# Patient Record
Sex: Female | Born: 1970 | Hispanic: Yes | Marital: Married | Smoking: Never smoker
Health system: Southern US, Community
[De-identification: ages and names within clinical notes are randomized; demographics above are authoritative.]

## PROBLEM LIST (undated history)

## (undated) DIAGNOSIS — F32A Depression, unspecified: Secondary | ICD-10-CM

## (undated) DIAGNOSIS — F419 Anxiety disorder, unspecified: Secondary | ICD-10-CM

## (undated) DIAGNOSIS — Z87442 Personal history of urinary calculi: Secondary | ICD-10-CM

## (undated) DIAGNOSIS — J45909 Unspecified asthma, uncomplicated: Secondary | ICD-10-CM

## (undated) DIAGNOSIS — A048 Other specified bacterial intestinal infections: Secondary | ICD-10-CM

## (undated) DIAGNOSIS — F329 Major depressive disorder, single episode, unspecified: Secondary | ICD-10-CM

## (undated) DIAGNOSIS — E7800 Pure hypercholesterolemia, unspecified: Secondary | ICD-10-CM

## (undated) DIAGNOSIS — D649 Anemia, unspecified: Secondary | ICD-10-CM

## (undated) DIAGNOSIS — B009 Herpesviral infection, unspecified: Secondary | ICD-10-CM

## (undated) DIAGNOSIS — M1990 Unspecified osteoarthritis, unspecified site: Secondary | ICD-10-CM

## (undated) HISTORY — DX: Personal history of urinary calculi: Z87.442

## (undated) HISTORY — DX: Depression, unspecified: F32.A

## (undated) HISTORY — DX: Unspecified asthma, uncomplicated: J45.909

## (undated) HISTORY — DX: Anxiety disorder, unspecified: F41.9

## (undated) HISTORY — DX: Pure hypercholesterolemia, unspecified: E78.00

## (undated) HISTORY — DX: Unspecified osteoarthritis, unspecified site: M19.90

## (undated) HISTORY — DX: Other specified bacterial intestinal infections: A04.8

## (undated) HISTORY — DX: Anemia, unspecified: D64.9

## (undated) HISTORY — DX: Major depressive disorder, single episode, unspecified: F32.9

## (undated) HISTORY — DX: Herpesviral infection, unspecified: B00.9

---

## 2003-03-04 ENCOUNTER — Emergency Department (HOSPITAL_COMMUNITY): Admission: EM | Admit: 2003-03-04 | Discharge: 2003-03-04 | Payer: Self-pay | Admitting: Emergency Medicine

## 2003-03-14 ENCOUNTER — Emergency Department (HOSPITAL_COMMUNITY): Admission: EM | Admit: 2003-03-14 | Discharge: 2003-03-14 | Payer: Self-pay | Admitting: Emergency Medicine

## 2003-12-06 ENCOUNTER — Emergency Department (HOSPITAL_COMMUNITY): Admission: EM | Admit: 2003-12-06 | Discharge: 2003-12-07 | Payer: Self-pay | Admitting: Emergency Medicine

## 2003-12-07 ENCOUNTER — Emergency Department (HOSPITAL_COMMUNITY): Admission: EM | Admit: 2003-12-07 | Discharge: 2003-12-08 | Payer: Self-pay | Admitting: Emergency Medicine

## 2005-01-05 ENCOUNTER — Encounter: Admission: RE | Admit: 2005-01-05 | Discharge: 2005-01-05 | Payer: Self-pay | Admitting: Obstetrics and Gynecology

## 2005-11-25 ENCOUNTER — Other Ambulatory Visit: Admission: RE | Admit: 2005-11-25 | Discharge: 2005-11-25 | Payer: Self-pay | Admitting: Obstetrics and Gynecology

## 2005-11-26 DIAGNOSIS — Z87442 Personal history of urinary calculi: Secondary | ICD-10-CM

## 2005-11-26 HISTORY — DX: Personal history of urinary calculi: Z87.442

## 2005-12-16 ENCOUNTER — Emergency Department (HOSPITAL_COMMUNITY): Admission: EM | Admit: 2005-12-16 | Discharge: 2005-12-16 | Payer: Self-pay | Admitting: Emergency Medicine

## 2006-05-26 ENCOUNTER — Other Ambulatory Visit: Admission: RE | Admit: 2006-05-26 | Discharge: 2006-05-26 | Payer: Self-pay | Admitting: Gynecology

## 2006-09-28 ENCOUNTER — Emergency Department (HOSPITAL_COMMUNITY): Admission: EM | Admit: 2006-09-28 | Discharge: 2006-09-28 | Payer: Self-pay | Admitting: Emergency Medicine

## 2007-01-24 ENCOUNTER — Other Ambulatory Visit: Admission: RE | Admit: 2007-01-24 | Discharge: 2007-01-24 | Payer: Self-pay | Admitting: Gynecology

## 2007-01-27 DIAGNOSIS — B009 Herpesviral infection, unspecified: Secondary | ICD-10-CM

## 2007-01-27 HISTORY — DX: Herpesviral infection, unspecified: B00.9

## 2007-11-21 ENCOUNTER — Other Ambulatory Visit: Admission: RE | Admit: 2007-11-21 | Discharge: 2007-11-21 | Payer: Self-pay | Admitting: Gynecology

## 2007-12-14 ENCOUNTER — Ambulatory Visit (HOSPITAL_COMMUNITY): Admission: RE | Admit: 2007-12-14 | Discharge: 2007-12-14 | Payer: Self-pay | Admitting: Gynecology

## 2007-12-26 ENCOUNTER — Encounter: Admission: RE | Admit: 2007-12-26 | Discharge: 2007-12-26 | Payer: Self-pay | Admitting: Gynecology

## 2008-03-12 ENCOUNTER — Emergency Department (HOSPITAL_COMMUNITY): Admission: EM | Admit: 2008-03-12 | Discharge: 2008-03-12 | Payer: Self-pay | Admitting: Emergency Medicine

## 2008-11-26 ENCOUNTER — Encounter: Payer: Self-pay | Admitting: Gynecology

## 2008-11-26 ENCOUNTER — Other Ambulatory Visit: Admission: RE | Admit: 2008-11-26 | Discharge: 2008-11-26 | Payer: Self-pay | Admitting: Gynecology

## 2008-11-26 ENCOUNTER — Ambulatory Visit: Payer: Self-pay | Admitting: Gynecology

## 2008-11-26 DIAGNOSIS — A048 Other specified bacterial intestinal infections: Secondary | ICD-10-CM

## 2008-11-26 HISTORY — DX: Other specified bacterial intestinal infections: A04.8

## 2008-12-10 ENCOUNTER — Ambulatory Visit: Payer: Self-pay | Admitting: Gynecology

## 2009-07-15 ENCOUNTER — Encounter: Admission: RE | Admit: 2009-07-15 | Discharge: 2009-07-15 | Payer: Self-pay | Admitting: Family Medicine

## 2009-12-20 ENCOUNTER — Other Ambulatory Visit: Admission: RE | Admit: 2009-12-20 | Discharge: 2009-12-20 | Payer: Self-pay | Admitting: Gynecology

## 2009-12-20 ENCOUNTER — Ambulatory Visit: Payer: Self-pay | Admitting: Gynecology

## 2009-12-27 ENCOUNTER — Ambulatory Visit: Payer: Self-pay | Admitting: Gynecology

## 2009-12-30 ENCOUNTER — Ambulatory Visit: Payer: Self-pay | Admitting: Gynecology

## 2010-03-14 ENCOUNTER — Ambulatory Visit: Payer: Self-pay | Admitting: Obstetrics and Gynecology

## 2010-12-29 ENCOUNTER — Encounter (INDEPENDENT_AMBULATORY_CARE_PROVIDER_SITE_OTHER): Payer: 59 | Admitting: Gynecology

## 2010-12-29 ENCOUNTER — Other Ambulatory Visit (HOSPITAL_COMMUNITY)
Admission: RE | Admit: 2010-12-29 | Discharge: 2010-12-29 | Disposition: A | Discharge status: Home / Self Care | Payer: 59 | Source: Ambulatory Visit | Attending: Gynecology | Admitting: Gynecology

## 2010-12-29 ENCOUNTER — Other Ambulatory Visit: Payer: Self-pay | Admitting: Gynecology

## 2010-12-29 DIAGNOSIS — Z124 Encounter for screening for malignant neoplasm of cervix: Secondary | ICD-10-CM | POA: Insufficient documentation

## 2010-12-29 DIAGNOSIS — Z01419 Encounter for gynecological examination (general) (routine) without abnormal findings: Secondary | ICD-10-CM

## 2011-01-05 ENCOUNTER — Ambulatory Visit (INDEPENDENT_AMBULATORY_CARE_PROVIDER_SITE_OTHER): Payer: 59 | Admitting: Gynecology

## 2011-01-05 ENCOUNTER — Other Ambulatory Visit: Payer: 59

## 2011-01-05 DIAGNOSIS — N926 Irregular menstruation, unspecified: Secondary | ICD-10-CM

## 2011-01-05 DIAGNOSIS — N8000 Endometriosis of the uterus, unspecified: Secondary | ICD-10-CM

## 2011-01-05 DIAGNOSIS — D259 Leiomyoma of uterus, unspecified: Secondary | ICD-10-CM

## 2011-01-05 DIAGNOSIS — N831 Corpus luteum cyst of ovary, unspecified side: Secondary | ICD-10-CM

## 2012-01-18 ENCOUNTER — Other Ambulatory Visit: Payer: Self-pay | Admitting: Gynecology

## 2012-01-18 DIAGNOSIS — Z1231 Encounter for screening mammogram for malignant neoplasm of breast: Secondary | ICD-10-CM

## 2012-02-03 ENCOUNTER — Ambulatory Visit
Admission: RE | Admit: 2012-02-03 | Discharge: 2012-02-03 | Disposition: A | Discharge status: Home / Self Care | Payer: BC Managed Care – PPO | Source: Ambulatory Visit | Attending: Gynecology | Admitting: Gynecology

## 2012-02-03 DIAGNOSIS — Z1231 Encounter for screening mammogram for malignant neoplasm of breast: Secondary | ICD-10-CM

## 2012-04-01 ENCOUNTER — Encounter: Payer: BC Managed Care – PPO | Admitting: Gynecology

## 2012-04-07 ENCOUNTER — Ambulatory Visit (INDEPENDENT_AMBULATORY_CARE_PROVIDER_SITE_OTHER): Payer: BC Managed Care – PPO | Admitting: Gynecology

## 2012-04-07 ENCOUNTER — Ambulatory Visit (INDEPENDENT_AMBULATORY_CARE_PROVIDER_SITE_OTHER): Payer: BC Managed Care – PPO

## 2012-04-07 ENCOUNTER — Encounter: Payer: Self-pay | Admitting: Gynecology

## 2012-04-07 VITALS — BP 112/66 | Ht 61.5 in | Wt 136.0 lb

## 2012-04-07 DIAGNOSIS — Z01419 Encounter for gynecological examination (general) (routine) without abnormal findings: Secondary | ICD-10-CM

## 2012-04-07 DIAGNOSIS — N949 Unspecified condition associated with female genital organs and menstrual cycle: Secondary | ICD-10-CM

## 2012-04-07 DIAGNOSIS — R102 Pelvic and perineal pain: Secondary | ICD-10-CM

## 2012-04-07 DIAGNOSIS — Z862 Personal history of diseases of the blood and blood-forming organs and certain disorders involving the immune mechanism: Secondary | ICD-10-CM

## 2012-04-07 DIAGNOSIS — E7800 Pure hypercholesterolemia, unspecified: Secondary | ICD-10-CM | POA: Insufficient documentation

## 2012-04-07 DIAGNOSIS — Z87898 Personal history of other specified conditions: Secondary | ICD-10-CM

## 2012-04-07 DIAGNOSIS — D251 Intramural leiomyoma of uterus: Secondary | ICD-10-CM

## 2012-04-07 DIAGNOSIS — N831 Corpus luteum cyst of ovary, unspecified side: Secondary | ICD-10-CM

## 2012-04-07 DIAGNOSIS — D259 Leiomyoma of uterus, unspecified: Secondary | ICD-10-CM

## 2012-04-07 DIAGNOSIS — Z8639 Personal history of other endocrine, nutritional and metabolic disease: Secondary | ICD-10-CM

## 2012-04-07 NOTE — Patient Instructions (Addendum)
Mantenimiento de la salud en las mujeres (Health Maintenance, Females) Un estilo de vida saludable y los cuidados preventivos pueden favorecer la salud y el bienestar.   Haga exmenes regulares de la salud en general, dentales y de los ojos.   Consuma una dieta saludable. Los alimentos como vegetales, frutas, granos enteros, productos lcteos descremados y protenas magras contienen los nutrientes que usted necesita sin necesidad de consumir muchas caloras. Disminuya el consumo de alimentos con alto contenido de grasas slidas, azcar y sal agregadas. Si es necesario, pdaleinformacin acerca de una dieta adecuada a su mdico.   La actividad fsica regular es una de las cosas ms importantes que puede hacer por su salud. Los adultos deben hacer al menos 150 minutos de ejercicios de intensidad moderada (cualquier actividad que aumente la frecuencia cardaca y lo haga transpirar) cada semana. Adems, la mayora de los adultos necesita ejercicios de fortalecimiento muscular 2  ms das por semana.    Mantenga un peso saludable. El ndice de masa corporal (IMC) es una herramienta que identifica posibles problemas con el peso. Proporciona una estimacin de la grasa corporal basndose en el peso y la altura. El mdico podr determinar su IMC y podr ayudarlo a lograr o mantener un peso saludable. Para los adultos de 20 aos o ms:   Un IMC menor a 18,5 se considera bajo peso.   Un IMC entre 18,5 y 24,9 es normal.   Un IMC entre 25 y 29,9 es sobrepeso.   Un IMC entre 30 o ms es obesidad.   Mantenga un nivel normal de lpidos y colesterol en sangre practicando actividad fsica y minimizando la ingesta de grasas saturadas. Consuma una dieta balanceada e incluya variedad de frutas y vegetales. Los anlisis de lpidos y colesterol en sangre deben comenzar a los 20 aos y repetirse cada 5 aos. Si los niveles de colesterol son altos, tiene ms de 50 aos o tiene riesgo elevado de sufrir enfermedades  cardacas, necesitar controlarse con ms frecuencia.Si tiene niveles elevados de lpidos y colesterol, debe recibir tratamiento con medicamentos, si la dieta y el ejercicio no son efectivos.   Si fuma, consulte con el profesional acerca de las opciones para dejar de hacerlo. Si no lo hace, no comience.   Si est embarazada no beba alcohol. Si est amamantando, beba alcohol con prudencia. Si elige beber alcohol, no se exceda de 1 medida por da. Se considera una medida a 12 onzas (355 ml) de cerveza, 5 onzas (148 ml) de vino, o 1,5 onzas (44 ml) de licor.   Evite el alcohol y el consumo de drogas. No comparta agujas. Pida ayuda si necesita asistencia o instrucciones con respecto a abandonar el consumo de alcohol, cigarrillos o drogas.   La hipertensin arterial causa enfermedades cardacas y aumenta el riesgo de ictus. Debe controlar su presin arterial al menos cada 1 o 2 aos. La presin arterial elevada que persiste debe tratarse con medicamentos si la prdida de peso y el ejercicio no son efectivos.   Si tiene entre 55 y 79 aos, consulte a su mdico si debe tomar aspirina para prevenir enfermedades cardacas.   Los anlisis para la diabetes incluyen la toma de una muestra de sangre para controlar el nivel de azcar en la sangre durante el ayuno. Debe hacerlo cada 3 aos despus de los 45 aos si est dentro de su peso normal y sin factores de riesgo para la diabetes. Las pruebas deben comenzar a edades tempranas o llevarse a cabo con ms frecuencia   si tiene sobrepeso y al menos 1 factor de riesgo para la diabetes.   Las evaluaciones para detectar el cncer de mama son un mtodo preventivo fundamental para las mujeres. Debe practicar la "autoconciencia de las mamas". Esto significa que debe reconocer la apariencia normal de sus mamas y como las siente y pudiendo incluir un autoexamen de mamas. Si detecta algn cambio, no importa cun pequeo sea, debe informarlo a su mdico. Las mujeres entre 20 y  40 aos deben hacer un examen clnico de las mamas como parte del examen regular de salud, cada 1 a 3 aos. Despus de los 40 aos deben hacerlo todos los aos. Deben hacerse una mamografa radografa de mamas ) cada ao, comenzando a los 40 aos. Las mujeres con historia familiar de cncer de mama deben hablar con el mdico para hacer un estudio gentico. Las que tienen ms riesgo deben hacerse resonancia magntica y una mamografa todos los aos.   Un test de Pap se realiza para diagnosticar cncer de cuello de tero. Las mujeres deben hacerse un test de Pap a partir de los 21 aos. Entre los 21 y los 29 aos debe repetirse cada dos aos. Luego de los 30 aos, debe realizarse un test de Pap cada tres aos siempre que los 3 estudios anteriores sean normales. Si le han realizado una histerectoma por un problema que no era cncer u otra enfermedad que podra causar cncer, ya no necesitar un test de Pap. Si tiene entre 65 y 70 aos y ha tenido un test de Pap normal en los ltimos 10 aos, ya no ser necesario realizarlo. Si ha recibido un tratamiento para el cncer cervical o para una enfermedad que podra causar cncer, necesitar realizar un test de Pap y controles durante al menos 20 aos de concluir el tratamiento. Si no se ha hecho el examen con regularidad, debern volver a evaluarse los factores de riesgo (como el tener un nuevo compaero sexual) para determinar si debe volver a realizarse los estudios. Algunas mujeres sufren problemas mdicos que aumentan la probabilidad de contraer cncer cervical. En estos casos, el mdico podr indicar que se realice el test de Pap con ms frecuencia.   La prueba del virus del papiloma humano (VPH) es un anlisis adicional que puede usarse para detectar cncer de cuello de tero. Esta prueba busca la presencia del virus que causa los cambios en el cuello. Las clulas que se recolectan durante el test de Pap pueden usarse para el VPH. La prueba para el VPH puede  usarse para evaluar a mujeres de ms de 30 aos y debe usarse en mujeres de cualquier edad cuyos resultados del test de Pap no sean claros. Despus de los 30 aos, las mujeres deben hacerse el anlisis para el VPH con la misma frecuencia que el test de Pap.   El cncer colorectal puede detectarse y con frecuencia puede prevenirse. La mayor parte de los estudios de rutina comienzan a los 50 aos y continan hasta los 75 aos. Sin embargo, el mdico podr aconsejarle que lo haga antes, si tiene factores de riesgo para el cncer de colon. Una vez por ao, el profesional le dar un kit de prueba para hallar sangre oculta en la materia fecal. La utilizacin de un tubo con una pequea cmara en su extremo para examinar directamente el colon (sigmoidoscopa o colonoscopa), puede detectar formas temprana de cncer colorectal. Hable con su mdico si tiene 50 aos, cuando comience con los estudios de rutina. El examen directo del   colon debe repetirse cada 5 a 10 aos, hasta los 75 aos, excepto que se encuentren formas tempranas de plipos precancerosos o pequeos bultos.   Se recomienda realizar un anlisis de sangre para detectar hepatitis C a todas las personas nacidas entre 1945 y 1965, y a todo aquel que tenga un riesgo conocido de haber contrado esta enfermedad.   Practique el sexo seguro. Use condones y evite las prcticas sexuales riesgosas para disminuir el contagio de enfermedades de transmisin sexual. Las mujeres sexualmente activas de 25 aos o menos deben controlarse para descartar clamidia, que es una infeccin de transmisin sexual frecuente. Las mujeres mayores que tengan mltiples compaeros tambin deben hacerse el anlisis para detectar clamidia. Se recomienda realizar anlisis para detectar otras enfermedades de transmisin sexual si es sexualmente activa y tiene riesgos.   La osteoporosis es una enfermedad en la que los huesos pierden los minerales y la fuerza por el avance de la edad. El  resultado pueden ser fracturas graves en los huesos. El riesgo de osteoporosis puede identificarse con una prueba de densidad sea. Las mujeres de ms de 65 aos y las que tengan riesgos de sufrir fracturas u osteoporosis deben pedir consejo a su mdico. Consulte a su mdico si debe tomar un suplemento de calcio o de vitamina D para reducir el riesgo de osteoporosis.   La menopausia se asocia a sntomas y riesgos fsicos. Se dispone de una terapia de reemplazo hormonal para disminuir los sntomas y los riesgos. Consulte a su mdico para saber si la terapia de reemplazo hormonal es conveniente para usted.   Use una pantalla solar con un factor SPF de 30 o mayor. Aplique pantalla de manera libre y repetida a lo largo del da. Pngase al resguardo del sol cuando la sombra sea ms pequea que usted. Protjase usando mangas y pantalones largos, un sombrero de ala ancha y gafas para el sol todo el ao, siempre que se encuentre en el exterior.   Informe a su mdico si aparecen nuevos lunares o los que tiene se modifican, especialmente en forma y color. Tambin notifique al mdico si un lunar es ms grande que el tamao de una goma de lpiz.   Mantngase al da con las vacunas.  Document Released: 09/03/2011 [employer] Patient Information 2012 [employer]. 

## 2012-04-07 NOTE — Progress Notes (Signed)
Heather Navarro Sep 24, 1971 [id number]   History:    41 y.o.  for annual gyn exam who stated that for the past several months she's had some right lower quadrant discomfort. She states sometimes discomfort is more pronounced right before her menses. She denies any dyspareunia. She stated she has normal menstrual cycles she states that sometimes she knows this-like discharge for a few days after her menses. Her primary physician did her lab work recently. Patient has hypercholesterolemia and is on simvastatin 20 mg daily patient had a mammogram this year which was normal. Patient does her monthly self breast examination..  Past medical history,surgical history, family history and social history were all reviewed and documented in the EPIC chart.  Gynecologic History Patient's last menstrual period was 03/14/2012. Contraception: none Last Pap: 2012. Results were: normal Last mammogram: 2013. Results were: normal  Obstetric History OB History    Grav Para Term Preterm Abortions TAB SAB Ect Mult Living   1 1  1      1      # Outc Date GA Lbr Len/2nd Wgt Sex Del Anes PTL Lv   1 PRE     M SVD  Yes Yes       ROS: A ROS was performed and pertinent positives and negatives are included in the history.  GENERAL: No fevers or chills. HEENT: No change in vision, no earache, sore throat or sinus congestion. NECK: No pain or stiffness. CARDIOVASCULAR: No chest pain or pressure. No palpitations. PULMONARY: No shortness of breath, cough or wheeze. GASTROINTESTINAL: No abdominal pain, nausea, vomiting or diarrhea, melena or bright red blood per rectum. GENITOURINARY: No urinary frequency, urgency, hesitancy or dysuria. MUSCULOSKELETAL: No joint or muscle pain, no back pain, no recent trauma. DERMATOLOGIC: No rash, no itching, no lesions. ENDOCRINE: No polyuria, polydipsia, no heat or cold intolerance. No recent change in weight. HEMATOLOGICAL: No anemia or easy bruising or bleeding. NEUROLOGIC: No headache,  seizures, numbness, tingling or weakness. PSYCHIATRIC: No depression, no loss of interest in normal activity or change in sleep pattern.     Exam: chaperone present  BP 112/66  Ht 5' 1.5" (1.562 m)  Wt 136 lb (61.689 kg)  BMI 25.28 kg/m2  LMP 03/14/2012  Body mass index is 25.28 kg/(m^2).  General appearance : Well developed well nourished female. No acute distress HEENT: Neck supple, trachea midline, no carotid bruits, no thyroidmegaly Lungs: Clear to auscultation, no rhonchi or wheezes, or rib retractions  Heart: Regular rate and rhythm, no murmurs or gallops Breast:Examined in sitting and supine position were symmetrical in appearance, no palpable masses or tenderness,  no skin retraction, no nipple inversion, no nipple discharge, no skin discoloration, no axillary or supraclavicular lymphadenopathy Abdomen: no palpable masses or tenderness, no rebound or guarding Extremities: no edema or skin discoloration or tenderness  Pelvic:  Bartholin, Urethra, Skene Glands: Within normal limits             Vagina: No gross lesions or discharge  Cervix: No gross lesions or discharge  Uterus  anteverted, normal size, shape and consistency, non-tender and mobile  Adnexa  tenderness adnexa on both sides incomplete exam will await results of ultrasound  Anus and perineum  normal   Rectovaginal  normal sphincter tone without palpated masses or tenderness             Hemoccult not done  Ultrasound: Uterus measures 7.9 x 5.2 x 4.4 cm with endometrial stripe of 12.4 mm (last menstrual period 03/14/2012) 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 Right ovary had a corpus luteum cyst measuring 17 x 17 mm with positive color flow in the periphery internal low level echoes left ovary was normal. Free fluid was noted in the cul-de-sac.    Assessment/Plan:  41 y.o. female for annual exam for which I would recommend a  probiotic gel to use intravaginally after intercourse and after her menses such as Repfresh or [place]. She was encouraged to do her monthly self breast examination. We discussed importance of calcium vitamin D and regular exercise for osteoporosis prevention. She's currently being evaluated by the orthopedic surgeon for her knee pains. 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 She was reassured that she has a corpus luteum cyst. Her symptoms may be attributed to mittelschmerz and she can take nonsteroidal over-the-counter when necessary. New Pap smear screening guidelines discussed. No Pap smear done today. Patient's primary physician has done her lab work. She was instructed to continue to do her monthly self breast examination. We will see her back in one year or when necessary.  Ok Edwards MD, 3:30 PM 04/07/2012

## 2013-08-09 ENCOUNTER — Other Ambulatory Visit: Payer: Self-pay

## 2013-08-09 DIAGNOSIS — Z1231 Encounter for screening mammogram for malignant neoplasm of breast: Secondary | ICD-10-CM

## 2013-09-01 ENCOUNTER — Encounter: Payer: Self-pay | Admitting: Gynecology

## 2013-09-01 ENCOUNTER — Ambulatory Visit (INDEPENDENT_AMBULATORY_CARE_PROVIDER_SITE_OTHER): Payer: BC Managed Care – PPO | Admitting: Gynecology

## 2013-09-01 VITALS — BP 120/78 | Ht 61.5 in | Wt 131.0 lb

## 2013-09-01 DIAGNOSIS — Z01419 Encounter for gynecological examination (general) (routine) without abnormal findings: Secondary | ICD-10-CM

## 2013-09-01 MED ORDER — ACYCLOVIR 5 % EX CREA: 1.0000 "application " | TOPICAL_CREAM | CUTANEOUS | Status: DC

## 2013-09-01 NOTE — Patient Instructions (Addendum)
Tetanus, Diphtheria (Td) Vaccine What You Need to Know WHY GET VACCINATED? Tetanus  and diphtheria are very serious diseases. They are rare in the [place] today, but people who do become infected often have severe complications. Td vaccine is used to protect adolescents and adults from both of these diseases. Both tetanus and diphtheria are infections caused by bacteria. Diphtheria spreads from person to person through coughing or sneezing. Tetanus-causing bacteria enter the body through cuts, scratches, or wounds. TETANUS (Lockjaw) causes painful muscle tightening and stiffness, usually all over the body.  It can lead to tightening of muscles in the head and neck so you can't open your mouth, swallow, or sometimes even breathe. Tetanus kills about 1 out of every 5 people who are infected. DIPHTHERIA can cause a thick coating to form in the back of the throat.  It can lead to breathing problems, paralysis, heart failure, and death. Before vaccines, the [place] saw as many as 200,000 cases a year of diphtheria and hundreds of cases of tetanus. Since vaccination began, cases of both diseases have dropped by about 99%. TD VACCINE Td vaccine can protect adolescents and adults from tetanus and diphtheria. Td is usually given as a booster dose every 10 years but it can also be given earlier after a severe and dirty wound or burn. Your doctor can give you more information. Td may safely be given at the same time as other vaccines. SOME PEOPLE SHOULD NOT GET THIS VACCINE  If you ever had a life-threatening allergic reaction after a dose of any tetanus or diphtheria containing vaccine, OR if you have a severe allergy to any part of this vaccine, you should not get Td. Tell your doctor if you have any severe allergies.  Talk to your doctor if you:  have epilepsy or another nervous system problem,  had severe pain or swelling after any vaccine containing diphtheria or tetanus,  ever had  Guillain Barr Syndrome (GBS),  aren't feeling well on the day the shot is scheduled. RISKS OF A VACCINE REACTION With a vaccine, like any medicine, there is a chance of side effects. These are usually mild and go away on their own. Serious side effects are also possible, but are very rare. Most people who get Td vaccine do not have any problems with it. Mild Problems  following Td (Did not interfere with activities)  Pain where the shot was given (about 8 people in 10)  Redness or swelling where the shot was given (about 1 person in 3)  Mild fever (about 1 person in 15)  Headache or Tiredness (uncommon) Moderate Problems following Td (Interfered with activities, but did not require medical attention)  Fever over 102 F (38.9 C) (rare) Severe Problems  following Td (Unable to perform usual activities; required medical attention)  Swelling, severe pain, bleeding, or redness in the arm where the shot was given (rare). Problems that could happen after any vaccine:  Brief fainting spells can happen after any medical procedure, including vaccination. Sitting or lying down for about 15 minutes can help prevent fainting, and injuries caused by a fall. Tell your doctor if you feel dizzy, or have vision changes or ringing in the ears.  Severe shoulder pain and reduced range of motion in the arm where a shot was given can happen, very rarely, after a vaccination.  Severe allergic reactions from a vaccine are very rare, estimated at less than 1 in a million doses. If one were to occur, it would   usually be within a few minutes to a few hours after the vaccination. WHAT IF THERE IS A SERIOUS REACTION? What should I look for?  Look for anything that concerns you, such as signs of a severe allergic reaction, very high fever, or behavior changes. Signs of a severe allergic reaction can include hives, swelling of the face and throat, difficulty breathing, a fast heartbeat, dizziness, and  weakness. These would usually start a few minutes to a few hours after the vaccination. What should I do?  If you think it is a severe allergic reaction or other emergency that can't wait, call 911 or get the person to the nearest hospital. Otherwise, call your doctor.  Afterward, the reaction should be reported to the Vaccine Adverse Event Reporting System (VAERS). Your doctor might file this report, or, you can do it yourself through the VAERS website or by calling 1-508-580-2611. VAERS is only for reporting reactions. They do not give medical advice. THE NATIONAL VACCINE INJURY COMPENSATION PROGRAM The National Vaccine Injury Compensation Program (VICP) is a federal program that was created to compensate people who may have been injured by certain vaccines. Persons who believe they may have been injured by a vaccine can learn about the program and about filing a claim by calling 1-(817) 169-8470 or visiting the [facility] website. HOW CAN I LEARN MORE?  Ask your doctor.  Contact your local or state health department.  Contact the Centers for Disease Control and Prevention (CDC):  Call 816-566-4783 (1-800-CDC-INFO)  Visit CDC's vaccines website CDC Td Vaccine Interim VIS (11/01/12) Document Released: 07/12/2006 Document Revised: 01/09/2013 Document Reviewed: 01/04/2013 [facility] Patient Information 2014 [place], [place]. IUD Informacin sobre el dispositivo intrauterino (Intrauterine Device Information) Un dispositivo intrauterino (DIU) se inserta en el tero e impide el embarazo. Hay dos tipos de DIU:   DIU de cobre: este tipo de DIU est recubierto con un alambre de cobre y se inserta dentro del tero. El cobre hace que el tero y las trompas de [place] produzcan un liquido que [employer] espermatozoides. El DIU de cobre puede Geneticist, molecular durante 10 aos.  DIU con hormona: este tipo de DIU contiene la hormona progestina (progesterona sinttica). Las hormonas hacen que el moco  cervical se haga ms espeso, lo que evita que el esperma ingrese al tero. Tambin hace que la membrana que recubre internamente al tero sea ms delgada lo que impide el implante del vulo fertilizado. La hormona debilita o destruye los espermatozoides que ingresan al tero. Alguno de los tipos de DIU hormonal pueden Geneticist, molecular durante 5 aos y otros tipos pueden dejarse en el lugar por 3 aos. El mdico se asegurar de que usted sea una buena candidata para usar el DIU. Converse con su mdico acerca de los posibles efectos secundarios.  VENTAJAS DEL DISPOSITIVO INTRAUTERINO  El DIU es muy eficaz, reversible, de accin prolongada y de bajo mantenimiento.  No hay efectos secundarios relacionados con el estrgeno.  El DIU puede ser utilizado durante la [employer].  No est asociado con el aumento de [place].  Funciona inmediatamente despus de la insercin.  El DIU hormonal funciona inmediatamente si se inserta dentro de los [address] del inicio del perodo. Ser necesario que utilice un mtodo anticonceptivo adicional durante 7 das si el DIU hormonal se inserta en algn otro momento del ciclo.  El DIU de cobre no interfiere con las hormonas femeninas.  El DIU hormonal puede hacer que los perodos menstruales abundantes se hagan ms ligeros y  que haya menos clicos.  El DIU hormonal puede usarse durante 3 a 5 aos.  El DIU de cobre puede usarse durante 10 aos. DESVENTAJAS DEL DISPOSITIVO INTRAUTERINO  El DIU hormonal puede estar asociado con patrones de sangrado irregular.  El DIU de cobre puede hacer que el flujo menstrual ms abundante y doloroso.  Puede experimentar clicos y sangrado vaginal despus de la insercin. Document Released: 03/04/2010 Document Revised: 05/17/2013 [facility] Patient Information 2014 [place], [place].

## 2013-09-01 NOTE — Progress Notes (Signed)
Heather Navarro 03-23-1971 [id number]   History:    42 y.o.  for annual gyn exam who wanted to discuss today some form of contraception. She is having normal menstrual cycle although this past cycle was low but different whereby she spotted for 2 days and her periods started. The most recent cycle was normal on November 30. She did have some mild cramping but otherwise is doing well. Her PCP is Dr. Katrinka Blazing who is been doing her blood work. 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 Right ovary had a corpus luteum cyst measuring 17 x 17 mm with positive color flow in the periphery internal low level echoes left ovary was normal. Free fluid was noted in the cul-de-sac.   Patient with no prior history of abnormal Pap smear. Her mammogram was December 2013 and was normal.     Past medical history,surgical history, family history and social history were all reviewed and documented in the EPIC chart.  Gynecologic History Patient's last menstrual period was 08/27/2013. Contraception: none Last Pap: 2012. Results were: normal Last mammogram: 2013. Results were: normal  Obstetric History OB History  Gravida Para Term Preterm AB SAB TAB Ectopic Multiple Living  1 1  1      1     # Outcome Date GA Lbr Len/2nd Weight Sex Delivery Anes PTL Lv  1 PRE     M SVD  Y Y       ROS: A ROS was performed and pertinent positives and negatives are included in the history.  GENERAL: No fevers or chills. HEENT: No change in vision, no earache, sore throat or sinus  congestion. NECK: No pain or stiffness. CARDIOVASCULAR: No chest pain or pressure. No palpitations. PULMONARY: No shortness of breath, cough or wheeze. GASTROINTESTINAL: No abdominal pain, nausea, vomiting or diarrhea, melena or bright red blood per rectum. GENITOURINARY: No urinary frequency, urgency, hesitancy or dysuria. MUSCULOSKELETAL: No joint or muscle pain, no back pain, no recent trauma. DERMATOLOGIC: No rash, no itching, no lesions. ENDOCRINE: No polyuria, polydipsia, no heat or cold intolerance. No recent change in weight. HEMATOLOGICAL: No anemia or easy bruising or bleeding. NEUROLOGIC: No headache, seizures, numbness, tingling or weakness. PSYCHIATRIC: No depression, no loss of interest in normal activity or change in sleep pattern.     Exam: chaperone present  BP 120/78  Ht 5' 1.5" (1.562 m)  Wt 131 lb (59.421 kg)  BMI 24.35 kg/m2  LMP 08/27/2013  Body mass index is 24.35 kg/(m^2).  General appearance : Well developed well nourished female. No acute distress HEENT: Neck supple, trachea midline, no carotid bruits, no thyroidmegaly Lungs: Clear to auscultation, no rhonchi or wheezes, or rib retractions  Heart: Regular rate and rhythm, no murmurs or gallops Breast:Examined in sitting and supine position were symmetrical in appearance, no palpable masses or tenderness,  no skin retraction, no nipple inversion, no nipple discharge, no skin discoloration, no axillary or supraclavicular lymphadenopathy Abdomen: no palpable masses or tenderness, no rebound or guarding Extremities: no edema or skin discoloration or  tenderness  Pelvic:  Bartholin, Urethra, Skene Glands: Within normal limits             Vagina: No gross lesions or discharge  Cervix: No gross lesions or discharge  Uterus  anteverted, normal size, shape and consistency, non-tender and mobile  Adnexa  Without masses or tenderness  Anus and perineum  normal   Rectovaginal  normal sphincter tone without palpated masses or  tenderness             Hemoccult not indicated     Assessment/Plan:  42 y.o. female for annual exam who is interested in proceeding with the ParaGard T380A IUD. We had discussed all different types of contraceptive options and she feels that this would work best for her since she has normal menstrual cycles. The risks benefits and pros and cons were discussed as well as literature and information was provided in [place]. Pap smear was not done today in accordance to the new guidelines. She was reminded to schedule her mammogram and to continue to do her monthly breast exam.  Note: This dictation was prepared with  Dragon/digital dictation along withSmart phrase technology. Any transcriptional errors that result from this process are unintentional.   Ok Edwards MD, 5:42 PM 09/01/2013

## 2013-09-06 ENCOUNTER — Ambulatory Visit
Admission: RE | Admit: 2013-09-06 | Discharge: 2013-09-06 | Disposition: A | Discharge status: Home / Self Care | Payer: BC Managed Care – PPO | Source: Ambulatory Visit

## 2013-09-06 DIAGNOSIS — Z1231 Encounter for screening mammogram for malignant neoplasm of breast: Secondary | ICD-10-CM

## 2013-09-08 ENCOUNTER — Telehealth: Payer: Self-pay | Admitting: Gynecology

## 2013-09-08 NOTE — Telephone Encounter (Signed)
09/08/13-LM VM for pt that her BC ins covers the Paraguard IUD at 100% for contraception. Also said needs to be done while on cycle. Pt to call if wants to proceed.WL

## 2014-07-30 ENCOUNTER — Encounter: Payer: Self-pay | Admitting: Gynecology

## 2014-10-30 ENCOUNTER — Ambulatory Visit (INDEPENDENT_AMBULATORY_CARE_PROVIDER_SITE_OTHER): Payer: BLUE CROSS/BLUE SHIELD | Admitting: Gynecology

## 2014-10-30 ENCOUNTER — Other Ambulatory Visit (HOSPITAL_COMMUNITY)
Admission: RE | Admit: 2014-10-30 | Discharge: 2014-10-30 | Disposition: A | Discharge status: Home / Self Care | Payer: BLUE CROSS/BLUE SHIELD | Source: Ambulatory Visit | Attending: Gynecology | Admitting: Gynecology

## 2014-10-30 ENCOUNTER — Encounter: Payer: Self-pay | Admitting: Gynecology

## 2014-10-30 VITALS — BP 126/78 | Ht 61.5 in | Wt 130.0 lb

## 2014-10-30 DIAGNOSIS — Z01419 Encounter for gynecological examination (general) (routine) without abnormal findings: Secondary | ICD-10-CM

## 2014-10-30 DIAGNOSIS — Z1151 Encounter for screening for human papillomavirus (HPV): Secondary | ICD-10-CM | POA: Insufficient documentation

## 2014-10-30 DIAGNOSIS — Z113 Encounter for screening for infections with a predominantly sexual mode of transmission: Secondary | ICD-10-CM

## 2014-10-30 DIAGNOSIS — Z01411 Encounter for gynecological examination (general) (routine) with abnormal findings: Secondary | ICD-10-CM | POA: Diagnosis not present

## 2014-10-30 DIAGNOSIS — R102 Pelvic and perineal pain: Secondary | ICD-10-CM

## 2014-10-30 DIAGNOSIS — F4321 Adjustment disorder with depressed mood: Secondary | ICD-10-CM | POA: Insufficient documentation

## 2014-10-30 DIAGNOSIS — Z8619 Personal history of other infectious and parasitic diseases: Secondary | ICD-10-CM | POA: Insufficient documentation

## 2014-10-30 LAB — WET PREP FOR TRICH, YEAST, CLUE
Clue Cells Wet Prep HPF POC: NONE SEEN
Trich, Wet Prep: NONE SEEN

## 2014-10-30 MED ORDER — PAROXETINE HCL ER 12.5 MG PO TB24: 12.5000 mg | ORAL_TABLET | ORAL | Status: DC

## 2014-10-30 MED ORDER — FLUCONAZOLE 150 MG PO TABS: 150.0000 mg | ORAL_TABLET | Freq: Once | ORAL | Status: DC

## 2014-10-30 MED ORDER — VALACYCLOVIR HCL 500 MG PO TABS: ORAL_TABLET | ORAL | Status: DC

## 2014-10-30 NOTE — Addendum Note (Signed)
Addended by: Terrance Mass on: 10/30/2014 04:53 PM   Modules accepted: Orders

## 2014-10-30 NOTE — Patient Instructions (Addendum)
Paroxetine tablets Qu es este medicamento? La PAROXETINA se utiliza para tratar la depresin. Este medicamento tambin puede ayudar a Leisure centre manager con trastornos de ansiedad, trastorno obsesivo-compulsivo, ataques de pnico, estrs postraumtico o trastorno disfrico premenstrual (TDPM). Este medicamento puede ser utilizado para otros usos; si tiene alguna pregunta consulte con su proveedor de atencin mdica o con su farmacutico. MARCAS COMERCIALES DISPONIBLES: Paxil, Pexeva Qu le debo informar a mi profesional de la salud antes de tomar este medicamento? Necesita saber si usted presenta alguno de los [employer] o situaciones: -trastornos de hemorragias -glaucoma -enfermedad cardiaca -enfermedad renal -enfermedad heptica -bajos niveles de sodio en la sangre -mana o trastorno bipolar -convulsiones -ideas suicidas, planes o intento; si usted o alguien de su familia ha intentado un suicidio previo -si ha tomado un IMAO, tales como [place], Eldepryl, Marplan, Nardil o Parnate -si toma medicamentos que tratan o previenen cogulos sanguneos -una reaccin alrgica o inusual a la paroxetina, a otros medicamentos, alimentos, colorantes o conservadores -si est embarazada o buscando quedar embarazada -si est amamantando a un beb Cmo debo utilizar este medicamento? Tome este medicamento por va oral con un vaso de agua. Siga las instrucciones de la etiqueta del [place]. Este medicamento se puede tomar con o sin alimentos. Tome sus dosis a intervalos regulares. No tome su medicamento con una frecuencia mayor a la indicada. No deje de tomar Coca-Cola repentinamente a menos que as indique su mdico. El detener este medicamento demasiado rpido puede causar efectos secundarios graves o puede empeorar su condicin. Su farmacutico le dar una gua del medicamento especial con cada receta y relleno. Asegrese de leer esta informacin cada vez cuidadosamente. Hable con su  pediatra para informarse acerca del uso de este medicamento en nios. Puede requerir atencin especial. Sobredosis: Pngase en contacto inmediatamente con un centro toxicolgico o una sala de urgencia si usted cree que haya tomado demasiado medicamento. ATENCIN: [employer] es solo para usted. No comparta este medicamento con nadie. Qu sucede si me olvido de una dosis? Si olvida una dosis, tmela lo antes posible. Si es casi la hora de la prxima dosis, tome slo esa dosis. No tome dosis adicionales o dobles. Qu puede interactuar con este medicamento? No tome esta medicina con ninguno de los siguientes medicamentos: -linezolid -IMAOs, tales como Carbex, Eldepryl, Marplan, Nardil y Parnate -azul de metileno (Mining engineer) -pimozida -tioridazina Esta medicina tambin puede interactuar con los siguientes medicamentos: -alcohol -anticidos -aspirina o medicamentos tipo aspirina -atomoxetina -ciertos medicamentos para la depresin, ansiedad o trastornos psicticos -ciertos medicamentos para el ritmo cardaco irregular, tales como propafenona, flecainida, encainida, y quinidina -ciertos medicamentos para migraas, tales como almotriptn, eletriptn, frovatriptn, naratriptn, riztriptn, sumatriptn, zolmitriptn -cimetidina -digoxina -diurticos -fentanilo -fosamprenavir/ritonavir -furazolidona -isoniazida -litio -medicamentos que tratan o previenen cogulos sanguneos, tales como warfarina, enoxaparina y dalteparina -medicamentos para conciliar el sueo -metoprolol -los Ava, medicamentos para el dolor o inflamacin, tales como ibuprofeno o naproxeno -fenobarbital -fenitona -procarbazina -prociclidina -rasagilina -suplementos como hierba de [place], kava kava, valeriana -tamoxifeno -teofilina -triptfano Puede ser que esta lista no menciona todas las posibles interacciones. Informe a su profesional de KB Home	[place] de [employer] productos a base de hierbas,  medicamentos de [place] o suplementos nutritivos que est tomando. Si usted fuma, consume bebidas alcohlicas o si utiliza drogas ilegales, indqueselo tambin a su profesional de KB Home	[place]. Algunas sustancias pueden interactuar con su medicamento. A qu debo estar atento al usar Coca-Cola? Informe a su mdico si sus sntomas no mejoran o si  empeoran. Visite a su mdico o a su profesional de la salud para chequear su evolucin peridicamente. Debido que puede ser necesario tomar este medicamento durante varias semanas para que sea posible observar sus efectos en forma [place], es importante que sigue su tratamiento como recetado por su mdico. Los pacientes y sus familias deben estar atentos si empeora la depresin o ideas suicidas. Tambin est atento a cambios repentinos o severos de emocin, tales como el sentirse ansioso, agitado, lleno de pnico, irritable, hostil, agresivo, impulsivo, inquietud severa, demasiado excitado y hiperactivo o dificultad para conciliar el sueo. Si esto ocurre, especialmente al comenzar con el tratamiento de un antidepresivo o al cambiar de dosis, comunquese con su profesional de KB Home	[place]. Puede experimentar somnolencia o mareos. No conduzca ni utilice maquinaria, ni haga nada que Associate Professor en estado de alerta hasta que sepa cmo le afecta este medicamento. No se siente ni se ponga de pie con rapidez, especialmente si es un paciente de edad avanzada. Esto reduce el riesgo de mareos o [employer]. El alcohol puede interferir con el efecto de este medicamento. Evite consumir bebidas alcohlicas. Se le podr secar la boca. Masticar chicle sin azcar, chupar caramelos duros y beber agua en abundancia le ayudar a mantener la boca hmeda. Qu efectos secundarios puedo tener al [employer] este medicamento? Efectos secundarios que debe informar a su mdico o a Barrister's clerk de la salud tan pronto como sea posible: -Chief of Staff como erupcin cutnea,  picazn o urticarias, hinchazn de la cara, labios o lengua -heces de color oscuro o de aspecto alquitranado, sangre el la orina o vomito -pulso cardiaco rpido, irregular -alucinaciones, prdida del contacto con la realidad -ereccin dolorosa o prolongada (en los hombres) -convulsiones -ideas suicidas u otros cambios de humor -dificultad para orinar o cambios en el volumen de orina -sangrado, magulladuras inusuales -cansancio o debilidad inusual -vmito Efectos secundarios que, por lo general, no requieren atencin mdica (debe informarlos a su mdico o a su profesional de la salud si persisten o si son molestos): -cambios en el apetito, peso -cambios en el deseo sexual o capacidad -estreimiento o diarrea -dificultad para conciliar el sueo -somnolencia -dolor de cabeza -aumento de la sudoracin -dolor o debilidad muscular -temblores Puede ser que esta lista no menciona todos los posibles efectos secundarios. Comunquese a su mdico por asesoramiento mdico Hum[employer] Inc. Usted puede informar los efectos secundarios a la FDA por telfono al 1-800-FDA-1088. Dnde debo guardar mi medicina? Mantngala fuera del alcance de los nios. [place] a [employer], entre 15 y 68 grados C (65 y 5 grados F). Mantenga el envase bien cerrado. Deseche todo el medicamento que no haya utilizado, despus de la fecha de vencimiento. ATENCIN: Este folleto es un resumen. Puede ser que no cubra toda la posible informacin. Si usted tiene preguntas acerca de esta medicina, consulte con su mdico, su farmacutico o su profesional de Technical sales engineer.  2015, Elsevier/Gold Standard. (2012-05-03 15:39:24) Depresin (Depression) La depresin es un sentimiento de tristeza, decaimiento, sufrimiento espiritual, melancola, pesimismo o vaco. En general, hay dos tipos de depresin:  Tristeza o afliccin normal. Todos tenemos este tipo de depresin de vez en cuando, despus de atravesar alguna  experiencia triste, como la prdida de un trabajo o el final de Theatre manager. Este tipo de depresin se considera normal, es de corta duracin y se Biomedical scientist en cuestin de unos [employer] a 2 semanas. La depresin correspondiente a la prdida de un ser querido (duelo) a menudo dura ms que  2semanas, pero normalmente mejora con el tiempo.  Depresin clnica. Este tipo de depresin dura ms que la tristeza o afliccin normal, o interfiere en su capacidad de funcionar en el hogar, en el trabajo y en la escuela. Tambin interfiere en las relaciones personales. Afecta casi todos los aspectos de la vida. La depresin clnica es una enfermedad. Los sntomas de la depresin tambin pueden tener causas diferentes a las mencionadas arriba, por ejemplo:  Enfermedades fsicas. Algunas enfermedades fsicas, como hipotiroidismo, anemia grave, tipos especficos de cncer, diabetes, convulsiones incontroladas, problemas cardacos y pulmonares, ictus y Social research officer, government crnico, se asocian con frecuencia a los sntomas de la depresin.  Efectos secundarios de algunos medicamentos recetados. En algunas personas, determinados tipos de medicamentos pueden causar sntomas de depresin.  Consumo de drogas. El consumo de alcohol y drogas puede causar sntomas de depresin. [place] y duelo normal son:  Jodelle Gross o llorar durante perodos cortos de [place].  Falta de preocupacin por todo (apata).  Dificultad para dormir o dormir demasiado.  No poder disfrutar de las cosas que antes disfrutaba.  El deseo de estar solo todo el tiempo (aislamiento social).  Falta de energa o motivacin.  Dificultad para concentrarse o recordar.  Cambios en el apetito o en el peso.  Inquietud o agitacin. Los sntomas de la depresin [place] son los mismos de la tristeza o duelo normal y tambin [employer] siguientes sntomas:  Sentirse triste o llorar todo el [place].  Sentimientos de culpa o  inutilidad.  Sentimientos de desesperanza o desamparo.  Pensamientos de suicidio o el deseo de daarse a s mismo (ideas suicidas).  Prdida de contacto con la realidad (sntomas psicticos). Ver o escuchar cosas que no son reales (alucinaciones) o tener creencias falsas acerca de su vida o de las personas que lo rodean (delirios y paranoia). DIAGNSTICO El diagnstico de depresin clnica por lo general est basado en la gravedad y la duracin de los sntomas. El mdico le har preguntas sobre su historia clnica y [place] de sustancias, para determinar si la causa de la depresin es una enfermedad fsica, el uso de medicamentos recetados o el abuso de sustancias. El mdico tambin podr indicarle anlisis de [place]. TRATAMIENTO Por lo general, la tristeza y la afliccin normal no requieren tratamiento. Pero a veces se indican antidepresivos durante el duelo para [employer] sntomas de depresin hasta que se resuelvan. El tratamiento de la depresin clnica depende de la gravedad de los sntomas, pero suele incluir antidepresivos, psicoterapia con un profesional de la salud mental o una combinacin de [place]. El mdico lo ayudar a Leisure centre manager tratamiento para usted. La depresin causada por una enfermedad fsica generalmente desaparece con tratamiento mdico adecuado de la enfermedad. Si un medicamento recetado le causa depresin, consulte al mdico si debe suspenderlo, disminuir la dosis o sustituirlo por otro medicamento. La depresin causada por el abuso de alcohol o de drogas desaparece al dejar de usar estas sustancias. Algunos adultos necesitan ayuda profesional para dejar de beber o de consumir drogas. SOLICITE ATENCIN MDICA DE INMEDIATO SI:  Tiene pensamientos acerca de lastimarse o daar a Producer, television/film/video.  Pierde el contacto con la realidad (tiene sntomas psicticos).  Est tomando medicamentos para la depresin y tiene efectos secundarios graves. [place] on Mental Illness: [email or web address]: [email or web address] Document Released: 09/14/2005 Document Revised: 01/29/2014 [employer] Patient Information 2015 [place], [place]. This information is not intended to replace advice given to  you by your health care provider. Make sure you discuss any questions you have with your health care provider. Trastorno de ansiedad generalizada (Generalized Anxiety Disorder) El trastorno de ansiedad generalizada es un trastorno mental. Interfiere en las funciones vitales, incluyendo las [place], el trabajo y la escuela.  Es diferente de la ansiedad normal que todas las personas experimentan en algn momento de su vida en respuesta a sucesos y Chief Executive Officer. En verdad, la ansiedad normal nos ayuda a prepararnos y Brewing technologist acontecimientos y actividades de la vida. La ansiedad normal desaparece despus de que el evento o la actividad ha finalizado.  El trastorno de ansiedad generalizada no est necesariamente relacionada con eventos o actividades especficas. Tambin causa un exceso de ansiedad en proporcin a sucesos o actividades especficas. En este trastorno la ansiedad es difcil de Chief Technology Officer. Los sntomas pueden variar de leves a muy graves. Las personas que sufren de trastorno de ansiedad generalizada pueden tener intensas olas de ansiedad con sntomas fsicos (ataques de pnico).  SNTOMAS  La ansiedad y la preocupacin asociada a este trastorno son difciles de Chief Technology Officer. Esta ansiedad y la preocupacin estn relacionados con muchos eventos de la vida y sus actividades y tambin ocurre durante ms [employer] que no ocurre, durante 6 meses o ms. Las personas que la sufren pueden tener tres o ms de los siguientes sntomas (uno o ms en los nios):   Customer service manager.  Dificultades de concentracin.   Irritabilidad.  Tensin muscular  Dificultad para dormirse o sueo poco  satisfactorio. DIAGNSTICO  Se diagnostica a travs de una evaluacin realizada por el mdico. El mdico le har preguntas acerca de su estado de nimo, sntomas fsicos y sucesos de [place] vida. Le har preguntas sobre su historia clnica, el consumo de alcohol o drogas, incluyendo los medicamentos recetados. [employer] un examen fsico e indicar anlisis de [place]. Ciertas enfermedades y el uso de determinadas sustancias pueden causar sntomas similares a este trastorno. Su mdico lo puede derivar a Teaching laboratory technician en salud mental para una evaluacin ms profunda.Belva Crome  Las terapias siguientes se utilizan en el tratamiento de este trastorno:   Medicamentos - Se recetan antidepresivos para el control diario a Barrister's clerk. Pueden indicarse tambin medicamentos para combatir la [place] graves, especialmente cuando ocurren ataques de pnico.   Terapia conversada (psicoterapia) Ciertos tipos de psicoterapia pueden ser tiles en el tratamiento del trastorno de ansiedad generalizada, proporcionando apoyo, educacin y Fish farm manager. Una forma de psicoterapia llamada terapia cognitivo-conductual puede ensearle formas saludables de pensar y Firefighter a los eventos y actividades de la vida diaria.  Tcnicasde manejo del estrs- Estas tcnicas incluyen el yoga, la meditacin y el ejercicio y pueden ser muy tiles cuando se practican con regularidad. Un especialista en salud mental puede ayudar a determinar qu tratamiento es mejor para usted. Algunas personas obtienen mejora con una terapia. Sin embargo, Producer, television/film/video requieren una combinacin de terapias.  Document Released: 01/09/2013 Document Revised: 01/29/2014 [facility] Patient Information 2015 [place]. This information is not intended to replace advice given to you by your health care provider. Make sure you discuss any questions you have with your health care provider. Vaginitis monilisica (Monilial Vaginitis) La  vaginitis es una inflamacin (irritacin, hinchazn) de la vagina y la vulva. Esta no es una enfermedad de transmisin sexual.  CAUSAS Este tipo de vaginitis lo causa un hongo (candida) que normalmente se encuentra en la vagina. El hongo candida se ha desarrollado Museum/gallery curator  punto de ocasionar problemas en el equilibrio qumico. SNTOMAS  Secrecin vaginal espesa y blanca.  Hinchazn, picazn, enrojecimiento e inflamacin de la vagina y en algunos casos de los labios vaginales (vulva).  Ardor o dolor al [employer].  Dolor en [place]. DIAGNSTICO Los factores que favorecen la vaginitis moniliasica son:  Kyla Balzarine de virginidad y postmenopusicas.  Embarazo.  Infecciones.  Sentir cansancio, estar enferma o estresada, especialmente si ya ha sufrido este problema en el pasado.  Diabetes Buen control ayudar a disminur la probabilidad.  Pldoras anticonceptivas  Ropa interior [place].  El uso de espumas de bao, aerosoles femeninos duchas vaginales o tampones con desodorante.  Algunos antibiticos (medicamentos que destruyen grmenes).  Si contrae alguna enfermedad puede sufrir recurrencias espordicas. [place] profesional que lo asiste prescribir medicamentos.  Hay diferentes tipos de cremas y supositorios vaginales que tratan especficamente la vaginitis monilisica. Para infecciones por hongos recurrentes, utilice un supositorio o crema en la vagina dos veces por semana, o segn se le indique.  Tambin podrn utilizarse cremas con corticoides o anti monilisicas para la picazn o la irritacin de la vulva. Consulte con el profesional que la asiste.  Si la crema no da resultado, podr aplicarse en la vagina una solucin con azul de metileno.  El consumo de yogur puede prevenir este tipo de vaginitis. INSTRUCCIONES [place] todos los medicamentos tal como se le indic.  No mantenga relaciones sexuales hasta que el tratamiento  se haya completado, o segn las indicaciones del profesional que la asiste.  Tome baos de asiento tibios.  No se aplique duchas vaginales.  No utilice tampones, especialmente los perfumados.  Use ropa interior de algodn  [employer] pantalones ajustados y las medias tipo panty.  Comunique a sus compaeros sexuales que sufre una infeccin por hongos. Ellos deben concurrir para un control mdico si tienen sntomas como una urticaria leve o picazn.  Sus compaeros sexuales deben tratarse tambin si la infeccin es difcil de Radiographer, therapeutic.  Practique el sexo seguro - use condones  Algunos medicamentos vaginales ocasionan fallas en los condones de ltex. Los medicamentos vaginales que pueden daar los condones son:  Building services engineer cleocina  Butoconazole (Femstat)  Terconazole (Terazol) supositorios vaginales  Miconazole (Monistat) (es un medicamento de venta libre) SOLICITE ATENCIN MDICA SI:  Waldron Session tiene una temperatura oral de ms de 38,9 C (102 F).  Si la infeccin empeora luego de 2 das de tratamiento.  Si la infeccin no mejora luego de 3 das de tratamiento.  Aparecen ampollas en o alrededor de la vagina.  Si aparece una hemorragia vaginal y no es el momento del perodo.  Siente dolor al [employer].  Presenta problemas intestinales.  Tiene dolor durante las [employer]. Document Released: 06/24/2005 Document Revised: 12/07/2011 [facility] Patient Information 2015 [place]. This information is not intended to replace advice given to you by your health care provider. Make sure you discuss any questions you have with your health care provider.

## 2014-10-30 NOTE — Progress Notes (Signed)
Heather Navarro Jun 09, 1971 [id number]   History:    44 y.o.  for annual gyn exam who had several complaints:  #1 patient separated from her husband living under the same roof is not having issues with depression and anxiety but has no suicidal ideation and has good family support. #2 patient complaining of vaginal discharge and would like to have an STD screen #3 several months lower abdominal discomfort right lower greater than left #4 recurrent HSV #5 discuss contraceptive options  Patient's primary physician is Dr. Tamala Julian who has been treating her for hyperlipidemia and has been doing her blood work. 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 Right ovary had a corpus luteum cyst measuring 17 x 17 mm with positive color flow in the periphery internal low level echoes left ovary was normal. Free fluid was noted in the cul-de-sac.   Patient with no past history of any abnormal Pap smear. She is overdue for her mammogram.  Past medical history,surgical history, family history and social history were all reviewed and documented in the EPIC chart.  Gynecologic History Patient's last menstrual period was 10/23/2014. Contraception: none Last Pap: 2012. Results were: normal Last mammogram: 2014. Results were: Normal but dense  Obstetric History OB History  Gravida Para Term Preterm AB SAB TAB Ectopic Multiple Living  1 1  1      1     # Outcome Date GA Lbr Len/2nd Weight Sex Delivery Anes PTL Lv  1 Preterm     M Vag-Spont  Y Y       ROS: A ROS was performed and pertinent positives and negatives are included in the history.  GENERAL: No fevers or chills. HEENT: No change in vision, no earache, sore throat or sinus congestion.  NECK: No pain or stiffness. CARDIOVASCULAR: No chest pain or pressure. No palpitations. PULMONARY: No shortness of breath, cough or wheeze. GASTROINTESTINAL: No abdominal pain, nausea, vomiting or diarrhea, melena or bright red blood per rectum. GENITOURINARY: No urinary frequency, urgency, hesitancy or dysuria. MUSCULOSKELETAL: No joint or muscle pain, no back pain, no recent trauma. DERMATOLOGIC: No rash, no itching, no lesions. ENDOCRINE: No polyuria, polydipsia, no heat or cold intolerance. No recent change in weight. HEMATOLOGICAL: No anemia or easy bruising or bleeding. NEUROLOGIC: No headache, seizures, numbness, tingling or weakness. PSYCHIATRIC: No depression, no loss of interest in normal activity or change in sleep pattern.     Exam: chaperone present  BP 126/78 mmHg  Ht 5' 1.5" (1.562 m)  Wt 130 lb (58.968 kg)  BMI 24.17 kg/m2  LMP 10/23/2014  Body mass index is 24.17 kg/(m^2).  General appearance : Well developed well nourished female. No acute distress HEENT: Neck supple, trachea midline, no carotid bruits, no thyroidmegaly Lungs: Clear to auscultation, no rhonchi or wheezes, or rib retractions  Heart: Regular rate and rhythm, no murmurs or gallops Breast:Examined in sitting and supine position were symmetrical in appearance, no palpable masses or tenderness,  no skin retraction, no nipple inversion, no nipple discharge, no skin discoloration, no axillary or supraclavicular lymphadenopathy Abdomen: no palpable masses or tenderness, no rebound or guarding Extremities: no edema or skin discoloration or tenderness  Pelvic:  Bartholin, Urethra, Skene Glands: Within normal limits  Vagina: Creamy discharge  Cervix: No gross lesions or discharge  Uterus  anteverted, normal size, shape and consistency, non-tender and mobile  Adnexa  Without masses or tenderness  Anus and perineum  normal   Rectovaginal  normal sphincter tone without palpated masses or tenderness              Hemoccult not indicated   Wet prep yeast moderate WBC rare bacteria GC and Chlamydia culture obtained  Assessment/Plan:  44 y.o. female for annual exam with several issues that were discussed today. #1 for her depression and anxiety she will be placed on Paxil 12.5 mg daily #2 for recurrent HSV she will be placed on Valtrex 500 mg one by mouth daily #3 as part of her STD screening GC and Chlamydia culture along with HIV, RPR, hepatitis B and C was obtained today #4 as a result of her ongoing pelvic pain she will have an ultrasound schedule here within the next few weeks #5 requisition to schedule a three-dimensional mammogram was provided. #6 patient's flu vaccine is up-to-date #7 literature information on the Mirena IUD was provided and after the cultures come back and her ultrasound is completed we will find a later date to place a Mirena IUD during her cycle. #8 Pap smear with HPV screening obtained  Additional length of time 30 minutes   Terrance Mass MD, 4:45 PM 10/30/2014

## 2014-10-31 LAB — GC/CHLAMYDIA PROBE AMP
CT PROBE, AMP APTIMA: NEGATIVE
GC Probe RNA: NEGATIVE

## 2014-10-31 LAB — HIV ANTIBODY (ROUTINE TESTING W REFLEX): HIV: NONREACTIVE

## 2014-10-31 LAB — RPR

## 2014-10-31 LAB — HEPATITIS B SURFACE ANTIGEN: Hepatitis B Surface Ag: NEGATIVE

## 2014-10-31 LAB — HEPATITIS C ANTIBODY: HCV Ab: NEGATIVE

## 2014-11-01 LAB — CYTOLOGY - PAP

## 2014-11-05 ENCOUNTER — Encounter: Payer: Self-pay | Admitting: Gynecology

## 2014-11-05 ENCOUNTER — Other Ambulatory Visit: Payer: Self-pay | Admitting: Gynecology

## 2014-11-05 ENCOUNTER — Ambulatory Visit (INDEPENDENT_AMBULATORY_CARE_PROVIDER_SITE_OTHER): Payer: BLUE CROSS/BLUE SHIELD | Admitting: Gynecology

## 2014-11-05 ENCOUNTER — Ambulatory Visit (INDEPENDENT_AMBULATORY_CARE_PROVIDER_SITE_OTHER): Payer: BLUE CROSS/BLUE SHIELD

## 2014-11-05 DIAGNOSIS — R102 Pelvic and perineal pain: Secondary | ICD-10-CM

## 2014-11-05 DIAGNOSIS — N9489 Other specified conditions associated with female genital organs and menstrual cycle: Secondary | ICD-10-CM

## 2014-11-05 DIAGNOSIS — D251 Intramural leiomyoma of uterus: Secondary | ICD-10-CM

## 2014-11-05 DIAGNOSIS — L739 Follicular disorder, unspecified: Secondary | ICD-10-CM

## 2014-11-05 MED ORDER — ERYTHROMYCIN BASE 500 MG PO TABS: 500.0000 mg | ORAL_TABLET | Freq: Two times a day (BID) | ORAL | Status: DC

## 2014-11-05 NOTE — Patient Instructions (Signed)
Foliculitis  (Folliculitis)  La foliculitis es el enrojecimiento, dolor e hinchazn (inflamacin) de los folculos pilosos. Puede ocurrir en cualquier parte del cuerpo. Las personas con un sistema inmunolgico debilitado, con diabetes u obesidad tienen mayor riesgo de sufrir foliculitis.  CAUSAS   Infecciones bacterianas. sta es la causa ms frecuente.  Infecciones por hongos.  Infecciones virales.  Contacto con ciertas sustancias qumicas, especialmente aceites y alquitrn. La foliculitis crnica puede ser el resultado de las bacterias que viven en las fosas nasales. Las bacterias pueden favorecer los brotes de foliculitis con el [place].  SNTOMAS  La foliculitis ocurre con mayor frecuencia en el cuero cabelludo, los muslos, las piernas, la [place], las nalgas y las reas donde el pelo se afeita con frecuencia. Una primera seal de foliculitis es una , lesin pequea llena de pus, de color blanco o amarillo, y que pica (pstula).[place] Honour lesiones aparecen en un folculo inflamado y rojo. Por lo general miden menos de 0.2 pulgadas (5 mm) de ancho. Cuando hay una infeccin del folculo que se hace ms profundo, se convierte en un fornculo. Un grupo de varios fornculos juntos forma una lesin mayor (carbunclo). El carbunclo ocurre en reas pilosas y sudorosas del cuerpo.  DIAGNSTICO  El mdico har el diagnstico con un examen fsico. Podr tomarle Truddie Coco de una de las lesiones y Personnel officer en un labloratorio. As se determinar la causa de la foliculitis.  TRATAMIENTO  El tratamiento podr incluir:   La aplicacin de compresas calientes en la zona afectada.  Tomar antibiticos por va oral o aplicarlos sobre la piel.  Drenaje de las lesiones si contienen una gran cantidad de pus o lquido.  Depilacin lser para casos de foliculitis de larga duracin. Esto ayuda a Ambulance person del pelo. INSTRUCCIONES PARA EL CUIDADO EN EL HOGAR   Aplique compresas calientes en la  zona afectada segn lo indique su mdico.  Si le han recetado medicamentos, tmelos segn las indicaciones. Tmelos todos, aunque se sienta mejor.  Tome medicamentos de venta libre para Barrister's clerk.  No rasure la piel irritada.  Concurra a las visitas de control con el mdico, segn las indicaciones. SOLICITE ATENCIN MDICA DE INMEDIATO SI:   Observa un aumento del enrojecimiento, hinchazn o dolor en la zona afectada.  Tiene fiebre. ASEGRESE DE QUE:   Comprende estas instrucciones.  Controlar su enfermedad.  Solicitar ayuda de inmediato si no mejora o si empeora. Document Released: 09/14/2005 Document Revised: 03/15/2012 [facility] Patient Information 2015 [place], [place]. This information is not intended to replace advice given to you by your health care provider. Make sure you discuss any questions you have with your health care provider.

## 2014-11-05 NOTE — Progress Notes (Signed)
   Patient presented to the office today to discuss her ultrasound as well as her recent blood work. Patient was seen in the office on February 2 see previous note outlining her history. Patient was having nonspecific low abdominal pelvic pains and very sporadic. Her ultrasound demonstrated the following:  Uterus measured 8.5 x 5.7 x 4.7 cm with endometrial stripe of 10.7 mm patient had a small fibroid 14 x 11 mm and the second measuring 11 mm. Right ovary was normal. Left ovary small follicle measuring 2.3 x 1.7 cm no fluid in the cul-de-sac no adnexal masses.  Last office visit patient wanted to have an STD screen which demonstrated a negative GC and Chlamydia culture, negative HIV RPR hepatitis B and C. Also because of her chronic HSV outbreak she was started on suppressive Valtrex 500 mg daily. For her depression and anxiety she started the Paxil 12.5 mg daily which is starting to help. Pap smear this year demonstrated ASCUS with no high-risk HPV and we had discussed the new guidelines that she would need a follow-up Pap smear with HPV screen in one year.  She brought up to my attention that she noted this sensitive nodular area underneath her axilla. Exam: Both breasts were examined sitting supine position both breasts were symmetrical in appearance no skin discoloration or nipple inversion no palpable mass or tenderness no supraclavicular axillary lymphadenopathy on the left but on the right axillary area there was a subdermal tender slightly inflamed area for folliculitis.  Assessment/plan: #1 Normal pelvic ultrasound patient reassured #2 ASCUS negative HPV we'll repeat in one year #3 right axillary folliculitis will be treated with erythromycin 500 mg one by mouth twice a day for 10 days and she'll return back in 2 weeks for follow-up. If this is completely resolved we'll send her for her overdue screening mammogram if not a diagnostic one will be ordered. #4 patient with her upcoming cycle  would like to have a Mirena IUD placed to help with her menorrhagia as well as for contraception

## 2014-11-20 ENCOUNTER — Encounter: Payer: Self-pay | Admitting: Gynecology

## 2014-11-20 ENCOUNTER — Ambulatory Visit (INDEPENDENT_AMBULATORY_CARE_PROVIDER_SITE_OTHER): Payer: BLUE CROSS/BLUE SHIELD | Admitting: Gynecology

## 2014-11-20 VITALS — BP 120/82

## 2014-11-20 DIAGNOSIS — N644 Mastodynia: Secondary | ICD-10-CM

## 2014-11-20 DIAGNOSIS — L739 Follicular disorder, unspecified: Secondary | ICD-10-CM

## 2014-11-20 NOTE — Progress Notes (Signed)
   Patient presented to the office today for her two-week follow-up as to her right axillary folliculitis for which she was started on erythromycin 500 mg one by mouth twice a day for 10 days. Patient is doing well and stated she no longer feels a nodularity underneath her axilla. She recently has complained of some mastodynia on both breasts since she is getting ready to start her menses. We had discussed before of placing a Mirena IUD which she will schedule with the start of her next cycle.  Exam: Blood pressure 120/82 Breast exam: Both breasts were examined in the sitting supine position. Both breasts are symmetrical in appearance no skin discoloration or nipple inversion no palpable masses or tenderness no supraclavicular axillary lymphadenopathy  Assessment/plan complete resolution of right axillary folliculitis. Patient was reminded to schedule her mammogram. We discussed vitamin E 600 units daily for mastodynia as well as cutting down on caffeine-containing products. We'll see patient in the next few weeks for placement of Mirena IUD.

## 2014-11-23 ENCOUNTER — Telehealth: Payer: Self-pay | Admitting: Gynecology

## 2014-11-23 NOTE — Telephone Encounter (Signed)
11/23/14-I LM VM for pt that her [facility] plan covers the Paraguard for contraception with a $50.00 copay. Advised scheduling during cycle.Pt to call if wishes to proceed.wl  BC Ref #[id number])

## 2015-09-09 ENCOUNTER — Other Ambulatory Visit: Payer: Self-pay

## 2015-09-09 DIAGNOSIS — Z1231 Encounter for screening mammogram for malignant neoplasm of breast: Secondary | ICD-10-CM

## 2015-10-02 ENCOUNTER — Ambulatory Visit: Payer: BLUE CROSS/BLUE SHIELD

## 2015-10-08 ENCOUNTER — Ambulatory Visit
Admission: RE | Admit: 2015-10-08 | Discharge: 2015-10-08 | Disposition: A | Discharge status: Home / Self Care | Payer: BLUE CROSS/BLUE SHIELD | Source: Ambulatory Visit

## 2015-10-08 DIAGNOSIS — Z1231 Encounter for screening mammogram for malignant neoplasm of breast: Secondary | ICD-10-CM

## 2016-04-14 ENCOUNTER — Ambulatory Visit (INDEPENDENT_AMBULATORY_CARE_PROVIDER_SITE_OTHER): Payer: BLUE CROSS/BLUE SHIELD | Admitting: Women's Health

## 2016-04-14 ENCOUNTER — Encounter: Payer: Self-pay | Admitting: Women's Health

## 2016-04-14 VITALS — BP 118/80 | Ht 61.0 in | Wt 130.0 lb

## 2016-04-14 DIAGNOSIS — N898 Other specified noninflammatory disorders of vagina: Secondary | ICD-10-CM | POA: Diagnosis not present

## 2016-04-14 DIAGNOSIS — N3000 Acute cystitis without hematuria: Secondary | ICD-10-CM | POA: Diagnosis not present

## 2016-04-14 DIAGNOSIS — R350 Frequency of micturition: Secondary | ICD-10-CM | POA: Diagnosis not present

## 2016-04-14 LAB — WET PREP FOR TRICH, YEAST, CLUE
CLUE CELLS WET PREP: NONE SEEN
Trich, Wet Prep: NONE SEEN
Yeast Wet Prep HPF POC: NONE SEEN

## 2016-04-14 LAB — URINALYSIS W MICROSCOPIC + REFLEX CULTURE
Bilirubin Urine: NEGATIVE
CASTS: NONE SEEN [LPF]
CRYSTALS: NONE SEEN [HPF]
GLUCOSE, UA: NEGATIVE
Ketones, ur: NEGATIVE
Leukocytes, UA: NEGATIVE
Nitrite: NEGATIVE
Protein, ur: NEGATIVE
Specific Gravity, Urine: 1.015 (ref 1.001–1.035)
Yeast: NONE SEEN [HPF]
pH: 5.5 (ref 5.0–8.0)

## 2016-04-14 MED ORDER — NITROFURANTOIN MONOHYD MACRO 100 MG PO CAPS: 100.0000 mg | ORAL_CAPSULE | Freq: Two times a day (BID) | ORAL | Status: AC

## 2016-04-14 NOTE — Progress Notes (Signed)
Patient ID: Ricarda Frame, female   DOB: 19-May-1971, 45 y.o.   MRN: [id number] Presents with complaint of increased urinary frequency, urinary burning for about 2 weeks that has increased in intensity, mild pain at end of urination. Also noted in increase in discharge. New partner/ condoms. Likely cycle. History of kidney stones.  Exam: Appears well. No CVAT. Sternal genitalia within normal limits, speculum exam scant white discharge wet prep negative. GC/Chlamydia culture taken. Bimanual no CMT or adnexal tenderness. UA trace blood, 0-2 RBCs, 0-5 WBCs, many bacteria  UTI symptoms  Plan: Macrobid twice daily for 7 days take with food. Urine culture pending. Reviewed importance of increasing water intake. UTI prevention discussed. Instructed to call if no relief of symptoms. Condoms encouraged until permanent partner. Keep annual exam appointment next month will check HIV, hepatitis and RPR.

## 2016-04-14 NOTE — Patient Instructions (Signed)

## 2016-04-15 LAB — GC/CHLAMYDIA PROBE AMP
CT Probe RNA: NOT DETECTED
GC PROBE AMP APTIMA: NOT DETECTED

## 2016-04-16 LAB — URINE CULTURE

## 2016-05-06 ENCOUNTER — Encounter: Payer: Self-pay | Admitting: Gynecology

## 2016-05-06 ENCOUNTER — Ambulatory Visit (INDEPENDENT_AMBULATORY_CARE_PROVIDER_SITE_OTHER): Payer: BLUE CROSS/BLUE SHIELD | Admitting: Gynecology

## 2016-05-06 VITALS — BP 122/80 | Ht 61.0 in | Wt 137.0 lb

## 2016-05-06 DIAGNOSIS — Z01419 Encounter for gynecological examination (general) (routine) without abnormal findings: Secondary | ICD-10-CM

## 2016-05-06 DIAGNOSIS — T782XXA Anaphylactic shock, unspecified, initial encounter: Secondary | ICD-10-CM

## 2016-05-06 DIAGNOSIS — R350 Frequency of micturition: Secondary | ICD-10-CM

## 2016-05-06 DIAGNOSIS — M545 Low back pain, unspecified: Secondary | ICD-10-CM

## 2016-05-06 DIAGNOSIS — Z113 Encounter for screening for infections with a predominantly sexual mode of transmission: Secondary | ICD-10-CM

## 2016-05-06 DIAGNOSIS — IMO0001 Reserved for inherently not codable concepts without codable children: Secondary | ICD-10-CM

## 2016-05-06 LAB — URINALYSIS W MICROSCOPIC + REFLEX CULTURE
Bilirubin Urine: NEGATIVE
Casts: NONE SEEN [LPF]
Crystals: NONE SEEN [HPF]
Glucose, UA: NEGATIVE
Hgb urine dipstick: NEGATIVE
KETONES UR: NEGATIVE
LEUKOCYTES UA: NEGATIVE
NITRITE: NEGATIVE
PH: 6 (ref 5.0–8.0)
Protein, ur: NEGATIVE
RBC / HPF: NONE SEEN RBC/HPF (ref <=2)
SPECIFIC GRAVITY, URINE: 1.015 (ref 1.001–1.035)
WBC, UA: NONE SEEN WBC/HPF (ref <=5)
YEAST: NONE SEEN [HPF]

## 2016-05-06 MED ORDER — VALACYCLOVIR HCL 500 MG PO TABS: ORAL_TABLET | ORAL | 12 refills | Status: DC

## 2016-05-06 MED ORDER — HYDROXYZINE HCL 10 MG PO TABS: 10.0000 mg | ORAL_TABLET | Freq: Three times a day (TID) | ORAL | 0 refills | Status: DC | PRN

## 2016-05-06 MED ORDER — PREDNISONE 10 MG (21) PO TBPK: 10.0000 mg | ORAL_TABLET | Freq: Every day | ORAL | 0 refills | Status: DC

## 2016-05-06 NOTE — Progress Notes (Signed)
Heather Navarro Jan 31, 1971 [id number]   History:    45 y.o.  for annual gyn exam on July 18 was treated for a suspected urinary tract infection with Macrobid and cystoscopy she's had itching on her upper and lower extremities. She is also interested in STD screen. She had one partner for short time 8 months ago since she has been separated from her husband. She also has history of HSV for which she takes Valtrex on a when necessary basis and needed a refill. Patient also has complaint is some urinary frequency but no dysuria. Several years ago she had kidney stone. She has complained of occasional left flank discomfort. Her PCP has been doing her blood work which is Dr. Tamala Julian. In the past she has had vitamin D deficiency but she stated all her blood work recently was normal. Patient with no previous history of any abnormal Pap smears.  Past medical history,surgical history, family history and social history were all reviewed and documented in the EPIC chart.  Gynecologic History Patient's last menstrual period was 04/04/2016. Contraception: none Last Pap: 2016. Results were: normal Last mammogram: 2017. Results were: normal  Obstetric History OB History  Gravida Para Term Preterm AB Living  1 1   1   1   SAB TAB Ectopic Multiple Live Births          1    # Outcome Date GA Lbr Len/2nd Weight Sex Delivery Anes PTL Lv  1 Preterm     M Vag-Spont  Y LIV       ROS: A ROS was performed and pertinent positives and negatives are included in the history.  GENERAL: No fevers or chills. HEENT: No change in vision, no earache, sore throat or sinus congestion. NECK: No pain or stiffness. CARDIOVASCULAR: No chest pain or pressure. No palpitations. PULMONARY: No shortness of breath, cough or wheeze. GASTROINTESTINAL: No abdominal pain, nausea, vomiting or diarrhea, melena or bright red blood per rectum. GENITOURINARY: No urinary frequency, urgency, hesitancy or dysuria. MUSCULOSKELETAL: No joint or  muscle pain, no back pain, no recent trauma. DERMATOLOGIC: Rash upper extremities and lower extremities. ENDOCRINE: No polyuria, polydipsia, no heat or cold intolerance. No recent change in weight. HEMATOLOGICAL: No anemia or easy bruising or bleeding. NEUROLOGIC: No headache, seizures, numbness, tingling or weakness. PSYCHIATRIC: No depression, no loss of interest in normal activity or change in sleep pattern.     Exam: chaperone present  BP 122/80   Ht 5\' 1"  (1.549 m)   Wt 137 lb (62.1 kg)   LMP 04/04/2016   BMI 25.89 kg/m   Body mass index is 25.89 kg/m.  General appearance : Well developed well nourished female. No acute distress HEENT: Eyes: no retinal hemorrhage or exudates,  Neck supple, trachea midline, no carotid bruits, no thyroidmegaly Lungs: Clear to auscultation, no rhonchi or wheezes, or rib retractions  Heart: Regular rate and rhythm, no murmurs or gallops Breast:Examined in sitting and supine position were symmetrical in appearance, no palpable masses or tenderness,  no skin retraction, no nipple inversion, no nipple discharge, no skin discoloration, no axillary or supraclavicular lymphadenopathy Abdomen: no palpable masses or tenderness, no rebound or guarding Extremities: no edema or skin discoloration or tenderness  Pelvic:  Bartholin, Urethra, Skene Glands: Within normal limits             Vagina: No gross lesions or discharge  Cervix: No gross lesions or discharge  Uterus  anteverted, normal size, shape and consistency, non-tender and mobile  Adnexa  Without masses or tenderness  Anus and perineum  normal   Rectovaginal  normal sphincter tone without palpated masses or tenderness             Hemoccult not indicated   Urinalysis few bacteria culture pending no red blood cell no white blood cell  Assessment/Plan:  45 y.o. female for annual exam who developed a reaction to Macrobid. She will be placed on a prednisone (Sterapred Unipak 21 tablet) 10 mg to take  over course of 6 days. For her pruritus she'll be prescribed Vistaril 10 mg to take half a tablet twice a day when necessary. As part of her STD screen a GC and Chlamydia culture was obtained and she will stop by the lab for the following blood were: HIV, RPR, hepatitis B and C. We'll wait for the results of the urine culture. Prescription refill for Valtrex for HSV to take on a when necessary basis was provided as well. Patient had been provided with information on the Bevier IUD in the past. Patient was reminded to do her monthly breast exam. Since she has had history vitamin D deficiency in the past have recommended she take vitamin D3 2000 units daily.  15 minutes of additional time was spent going over her anaphylactic reaction to Macrobid and treatment options as well as discussing her past history vitamin D deficiency and tiredness and fatigue.   Terrance Mass MD, 5:06 PM 05/06/2016

## 2016-05-06 NOTE — Patient Instructions (Signed)
Hydroxyzine capsules or tablets Qu es este medicamento? La HIDROXIZINA es un antihistamnico. Este medicamento se [place] para tratar los sntomas de [place]. Tambin se [place] para tratar la ansiedad y tensin. Se puede Contractor en combinacin con otros medicamentos para inducir el sueo antes de una operacin [place]. Este medicamento puede ser utilizado para otros usos; si tiene alguna pregunta consulte con su proveedor de atencin mdica o con su farmacutico. Qu le debo informar a mi profesional de la salud antes de tomar este medicamento? Necesita saber si usted presenta alguno de los siguientes problemas o situaciones: -otras enfermedades crnicas -dificultad para Garment/textile technologist -glaucoma -enfermedad cardiaca -enfermedad renal -enfermedad heptica -enfermedad pulmonar -una reaccin alrgica o inusual a la hidroxizina, a la cetirizina, a otros medicamentos, alimentos, colorantes o conservantes -si est embarazada o buscando quedar embarazada -si est amamantando a un beb Cmo debo utilizar este medicamento? Tome este medicamento por va oral con un vaso lleno de agua. Siga las instrucciones de la etiqueta del [place]. Este medicamento se puede tomar con alimentos o con el [place] sus dosis a intervalos regulares. No tome su medicamento con una frecuencia mayor a la indicada. Hable con su pediatra para informarse acerca del uso de este medicamento en nios. Puede requerir atencin especial. Aunque este medicamento ha sido recetado a nios tan menores como de 6 aos de edad para condiciones selectivas, las precauciones se aplican. Los pacientes de ms de 65 aos de edad pueden presentar reacciones ms fuertes y Designer, industrial/product dosis [employer] Sobredosis: Pngase en contacto inmediatamente con un centro toxicolgico o una sala de urgencia si usted cree que haya tomado demasiado medicamento. ATENCIN: [employer] es solo para usted. No comparta este  medicamento con nadie. Qu sucede si me olvido de una dosis? Si olvida una dosis, tmela lo antes posible. Si es casi la hora de la prxima dosis, tome slo esa dosis. No tome dosis adicionales o dobles. Qu puede interactuar con este medicamento? -alcohol -barbitricos para el sueo o para las convulsiones -medicamentos para resfros o Set designer -medicamentos para la depresin, ansiedad o trastornos emocionales -analgsicos -medicamentos para conciliar el sueo -relajantes musculares Puede ser que esta lista no menciona todas las posibles interacciones. Informe a su profesional de [employer]	[place] de [employer] productos a base de hierbas, medicamentos de [place] o suplementos nutritivos que est tomando. Si usted fuma, consume bebidas alcohlicas o si utiliza drogas ilegales, indqueselo tambin a su profesional de [employer]	[place]. Algunas sustancias pueden interactuar con su medicamento. A qu debo estar atento al usar Coca[employer]Cola? Si los sntomas no mejoran, consulte a su mdico o a su profesional de [employer]	[place]. Puede experimentar mareos o somnolencia. No conduzca ni utilice maquinaria ni haga nada que Associate Professor en estado de alerta hasta que sepa cmo le afecta este medicamento. No se siente ni se ponga de pie con rapidez, especialmente si es un paciente de edad avanzada. Esto reduce el riesgo de mareos o [employer]. El alcohol puede interferir con el efecto de este medicamento. Evite consumir bebidas alcohlicas. Se le podr secar la boca. Masticar chicle sin azcar, chupar caramelos duros y tomar agua en abundancia le ayudar a mantener la boca hmeda. Si el problema no desaparece o es severo, consulte a su mdico. Este medicamento puede resecarle los ojos y provocar visin borrosa. Si [place] lentes de contacto, puede sentir ciertas molestias. Las gotas lubricantes pueden ser tiles. Si el problema no desaparece o es severo, consulte a su mdico de los ojos.  Si se est realizando pruebas  cutneas debido a una alergia, informe a su mdico que est recibiendo Coca[employer]Cola. Qu efectos secundarios puedo tener al [employer] este medicamento? Efectos secundarios que debe informar a su mdico o a Barrister's clerk de la salud tan pronto como sea posible: -pulso cardiaco rpido o irregular -dificultad para orinar -convulsiones -hablar arrastrando las palabras o confusin -temblores Efectos secundarios que, por lo general, no requieren Geophysical data processor (debe informarlos a su mdico o a su profesional de la salud si persisten o si son molestos): -estreimiento -somnolencia -fatiga -dolor de cabeza -Higher education careers adviser Puede ser que esta lista no menciona todos los posibles efectos secundarios. Comunquese a su mdico por asesoramiento mdico [employer]. Usted puede informar los efectos secundarios a la FDA por telfono al 1-800-FDA-1088. Dnde debo guardar mi medicina? Mantngala fuera del alcance de los nios. [place]dela a [employer], entre 15 y 77 grados C (75 y 26 grados F). Mantenga el envase bien cerrado. Deseche todo el medicamento que no haya utilizado, despus de la fecha de vencimiento. ATENCIN: Este folleto es un resumen. Puede ser que no cubra toda la posible informacin. Si usted tiene preguntas acerca de esta medicina, consulte con su mdico, su farmacutico o su profesional de Technical sales engineer.    2016, Elsevier/Gold Standard. (2014-11-06 00:00:00)

## 2016-05-07 ENCOUNTER — Telehealth: Payer: Self-pay

## 2016-05-07 LAB — RPR

## 2016-05-07 LAB — HEPATITIS B SURFACE ANTIGEN: Hepatitis B Surface Ag: NEGATIVE

## 2016-05-07 LAB — HIV ANTIBODY (ROUTINE TESTING W REFLEX): HIV: NONREACTIVE

## 2016-05-07 LAB — HEPATITIS C ANTIBODY: HCV Ab: NEGATIVE

## 2016-05-07 LAB — GC/CHLAMYDIA PROBE AMP
CT PROBE, AMP APTIMA: NOT DETECTED
GC PROBE AMP APTIMA: NOT DETECTED

## 2016-05-07 MED ORDER — HYDROXYZINE HCL 10 MG PO TABS: ORAL_TABLET | ORAL | 0 refills | Status: DC

## 2016-05-07 NOTE — Progress Notes (Signed)
Called into pharmacy

## 2016-05-07 NOTE — Telephone Encounter (Signed)
The pharmacy faxed a note because there were two different directions on the generic Vistaril that Dr. Moshe Salisbury prescribed and they need to know which directions to use. Dr. Durenda Guthrie office note said she will take 1/2 tablet twice daily when she needs it. Rx resent with corrected instructions.

## 2016-05-08 LAB — URINE CULTURE: Organism ID, Bacteria: 10000

## 2016-10-16 ENCOUNTER — Other Ambulatory Visit: Payer: Self-pay | Admitting: Gynecology

## 2017-02-10 ENCOUNTER — Encounter: Payer: Self-pay | Admitting: Gynecology

## 2017-10-14 ENCOUNTER — Encounter: Payer: Self-pay | Admitting: Gynecology

## 2017-10-14 ENCOUNTER — Ambulatory Visit (INDEPENDENT_AMBULATORY_CARE_PROVIDER_SITE_OTHER): Payer: Self-pay | Admitting: Gynecology

## 2017-10-14 VITALS — BP 118/76

## 2017-10-14 DIAGNOSIS — N898 Other specified noninflammatory disorders of vagina: Secondary | ICD-10-CM

## 2017-10-14 DIAGNOSIS — N3000 Acute cystitis without hematuria: Secondary | ICD-10-CM

## 2017-10-14 LAB — URINALYSIS W MICROSCOPIC + REFLEX CULTURE
Bilirubin Urine: NEGATIVE
Glucose, UA: NEGATIVE
HYALINE CAST: NONE SEEN /LPF
Ketones, ur: NEGATIVE
Nitrites, Initial: NEGATIVE
PROTEIN: NEGATIVE
Specific Gravity, Urine: 1.018 (ref 1.001–1.03)
pH: 5.5 (ref 5.0–8.0)

## 2017-10-14 LAB — WET PREP FOR TRICH, YEAST, CLUE

## 2017-10-14 MED ORDER — FLUCONAZOLE 150 MG PO TABS: 150.0000 mg | ORAL_TABLET | Freq: Once | ORAL | 0 refills | Status: AC

## 2017-10-14 MED ORDER — CIPROFLOXACIN HCL 250 MG PO TABS: 250.0000 mg | ORAL_TABLET | Freq: Two times a day (BID) | ORAL | 0 refills | Status: DC

## 2017-10-14 NOTE — Addendum Note (Signed)
Addended by: Nelva Nay on: 10/14/2017 04:34 PM   Modules accepted: Orders

## 2017-10-14 NOTE — Progress Notes (Signed)
    Heather Navarro [birth year] [id number]        47 y.o.  G1P0101 former patient of Dr. Toney Rakes who presents complaining of urinary frequency, urgency, dysuria and intermittent low-grade fevers starting in November.  Has been treating herself with OTC products because of lack of insurance.  No nausea vomiting diarrhea constipation.  Also complaining of vaginal discharge with itching of the last month or 2.  No odor.  Past medical history,surgical history, problem list, medications, allergies, family history and social history were all reviewed and documented in the EPIC chart.  Directed ROS with pertinent positives and negatives documented in the history of present illness/assessment and plan.  Exam: Caryn Bee assistant Vitals:   10/14/17 1602  BP: 118/76   General appearance:  Normal Spine straight without CVA tenderness Abdomen soft nontender without masses guarding rebound Pelvic external BUS vagina with mucousy white discharge.  Cervix normal.  Uterus normal size midline mobile nontender.  Adnexa without masses or tenderness.  Assessment/Plan:  47 y.o. G1P0101 with history and exam as above.  Urine analysis consistent with UTI.  She is allergic to Septra and Macrodantin.  Will treat with ciprofloxacin 250 mg twice daily times 7 days.  Wet prep is negative.  Given her symptoms of itching going to cover her for yeast with Diflucan 150 mg x1 dose.  Follow-up if any of her symptoms persist, worsen or recur.  Patient also is going to schedule an appointment for an annual exam for which she is overdue.    Anastasio Auerbach MD, 4:13 PM 10/14/2017

## 2017-10-14 NOTE — Patient Instructions (Signed)
Take the Diflucan antibiotic 1 pill only to treat the vaginal itching. Take the ciprofloxacin antibiotic twice daily for 7 days to treat the urinary tract infection.  Follow-up if your symptoms persist, worsen or recur.

## 2018-01-25 ENCOUNTER — Other Ambulatory Visit: Payer: Self-pay | Admitting: Gynecology

## 2018-01-25 ENCOUNTER — Telehealth: Payer: Self-pay

## 2018-01-25 MED ORDER — CIPROFLOXACIN HCL 250 MG PO TABS: ORAL_TABLET | ORAL | 0 refills | Status: DC

## 2018-01-25 NOTE — Telephone Encounter (Signed)
Patient called stating she is having another UTI. She said when she saw Dr. Loetta Rough in January that he told her to call if she had another one that she thought intercourse had caused and he would prescribe. She said today she is having very painful urination, having to go every 5 minutes and only urinates a small amount.    I reminded her that she is overdue for annual exam. She said she still does not have insurance and was waiting for her tax return to be able to come for that visit. When I call her back she will have checked her calendar and we will schedule her a month or two from now when hopefully she will have the financial means to have CE.

## 2018-01-25 NOTE — Telephone Encounter (Signed)
Okay for ciprofloxacin 250 mg twice daily x5 days. 

## 2018-01-25 NOTE — Telephone Encounter (Signed)
Patient informed regarding Rx. Rx sent. Patient transferred to front desk to schedule CE.

## 2018-03-29 ENCOUNTER — Encounter: Payer: Self-pay | Admitting: Gynecology

## 2018-03-29 DIAGNOSIS — Z0289 Encounter for other administrative examinations: Secondary | ICD-10-CM

## 2019-06-21 ENCOUNTER — Encounter: Payer: Self-pay | Admitting: Gynecology

## 2020-10-02 DIAGNOSIS — R55 Syncope and collapse: Secondary | ICD-10-CM | POA: Insufficient documentation

## 2020-10-02 DIAGNOSIS — S0990XA Unspecified injury of head, initial encounter: Secondary | ICD-10-CM | POA: Insufficient documentation

## 2020-10-02 DIAGNOSIS — Z87898 Personal history of other specified conditions: Secondary | ICD-10-CM | POA: Insufficient documentation

## 2020-11-06 DIAGNOSIS — H9312 Tinnitus, left ear: Secondary | ICD-10-CM | POA: Insufficient documentation

## 2020-11-06 DIAGNOSIS — R040 Epistaxis: Secondary | ICD-10-CM | POA: Insufficient documentation

## 2022-01-06 ENCOUNTER — Encounter: Payer: Self-pay | Admitting: Emergency Medicine

## 2022-01-06 ENCOUNTER — Ambulatory Visit: Payer: Managed Care, Other (non HMO) | Admitting: Emergency Medicine

## 2022-01-06 VITALS — BP 112/60 | HR 65 | Temp 98.0°F | Ht 61.0 in | Wt 134.4 lb

## 2022-01-06 DIAGNOSIS — Z13220 Encounter for screening for lipoid disorders: Secondary | ICD-10-CM

## 2022-01-06 DIAGNOSIS — Z13228 Encounter for screening for other metabolic disorders: Secondary | ICD-10-CM

## 2022-01-06 DIAGNOSIS — Z130 Encounter for screening for diseases of the blood and blood-forming organs and certain disorders involving the immune mechanism: Secondary | ICD-10-CM

## 2022-01-06 DIAGNOSIS — Z1329 Encounter for screening for other suspected endocrine disorder: Secondary | ICD-10-CM | POA: Diagnosis not present

## 2022-01-06 DIAGNOSIS — Z1211 Encounter for screening for malignant neoplasm of colon: Secondary | ICD-10-CM | POA: Diagnosis not present

## 2022-01-06 DIAGNOSIS — Z7689 Persons encountering health services in other specified circumstances: Secondary | ICD-10-CM

## 2022-01-06 DIAGNOSIS — Z0000 Encounter for general adult medical examination without abnormal findings: Secondary | ICD-10-CM | POA: Diagnosis not present

## 2022-01-06 DIAGNOSIS — Z1231 Encounter for screening mammogram for malignant neoplasm of breast: Secondary | ICD-10-CM

## 2022-01-06 LAB — CBC WITH DIFFERENTIAL/PLATELET
Basophils Absolute: 0 10*3/uL (ref 0.0–0.1)
Basophils Relative: 0.3 % (ref 0.0–3.0)
Eosinophils Absolute: 0.2 10*3/uL (ref 0.0–0.7)
Eosinophils Relative: 4.1 % (ref 0.0–5.0)
HCT: 36.5 % (ref 36.0–46.0)
Hemoglobin: 11.9 g/dL — ABNORMAL LOW (ref 12.0–15.0)
Lymphocytes Relative: 27.8 % (ref 12.0–46.0)
Lymphs Abs: 1.5 10*3/uL (ref 0.7–4.0)
MCHC: 32.6 g/dL (ref 30.0–36.0)
MCV: 82.6 fl (ref 78.0–100.0)
Monocytes Absolute: 0.5 10*3/uL (ref 0.1–1.0)
Monocytes Relative: 9.4 % (ref 3.0–12.0)
Neutro Abs: 3.1 10*3/uL (ref 1.4–7.7)
Neutrophils Relative %: 58.4 % (ref 43.0–77.0)
Platelets: 273 10*3/uL (ref 150.0–400.0)
RBC: 4.41 Mil/uL (ref 3.87–5.11)
RDW: 14 % (ref 11.5–15.5)
WBC: 5.3 10*3/uL (ref 4.0–10.5)

## 2022-01-06 LAB — COMPREHENSIVE METABOLIC PANEL
ALT: 25 U/L (ref 0–35)
AST: 23 U/L (ref 0–37)
Albumin: 4.3 g/dL (ref 3.5–5.2)
Alkaline Phosphatase: 98 U/L (ref 39–117)
BUN: 11 mg/dL (ref 6–23)
CO2: 28 mEq/L (ref 19–32)
Calcium: 10.7 mg/dL — ABNORMAL HIGH (ref 8.4–10.5)
Chloride: 104 mEq/L (ref 96–112)
Creatinine, Ser: 0.61 mg/dL (ref 0.40–1.20)
GFR: 102.72 mL/min (ref >60.00)
Glucose, Bld: 87 mg/dL (ref 70–99)
Potassium: 4.1 mEq/L (ref 3.5–5.1)
Sodium: 139 mEq/L (ref 135–145)
Total Bilirubin: 0.4 mg/dL (ref 0.2–1.2)
Total Protein: 7.3 g/dL (ref 6.0–8.3)

## 2022-01-06 LAB — LIPID PANEL
Cholesterol: 266 mg/dL — ABNORMAL HIGH (ref 0–200)
HDL: 62.8 mg/dL (ref >39.00)
LDL Cholesterol: 173 mg/dL — ABNORMAL HIGH (ref 0–99)
NonHDL: 203.19
Total CHOL/HDL Ratio: 4
Triglycerides: 153 mg/dL — ABNORMAL HIGH (ref 0.0–149.0)
VLDL: 30.6 mg/dL (ref 0.0–40.0)

## 2022-01-06 LAB — TSH: TSH: 3.3 u[IU]/mL (ref 0.35–5.50)

## 2022-01-06 LAB — HEMOGLOBIN A1C: Hgb A1c MFr Bld: 6.5 % (ref 4.6–6.5)

## 2022-01-06 LAB — VITAMIN D 25 HYDROXY (VIT D DEFICIENCY, FRACTURES): VITD: 18.4 ng/mL — ABNORMAL LOW (ref 30.00–100.00)

## 2022-01-06 LAB — VITAMIN B12: Vitamin B-12: 140 pg/mL — ABNORMAL LOW (ref 211–911)

## 2022-01-06 NOTE — Patient Instructions (Signed)

## 2022-01-06 NOTE — Progress Notes (Signed)
Heather Navarro ?51 y.o. ? ? ?Chief Complaint  ?Patient presents with  ?? Establish Care  ?  Hasnt been to Dr in over 5 years. Last meal was at 7:30 this morning and would like blood work done.   ? ? ?HISTORY OF PRESENT ILLNESS: ?This is a 51 y.o. female first visit to this office, here to establish care with me.  Requesting physical and blood work. ?No chronic medical problems.  No chronic medications. ?Healthy female with a healthy lifestyle. ?Originally from [place]. ?Sees gynecologist on a regular basis.  Up-to-date with Pap smear. ?Needs referral for both mammogram and colonoscopy. ? ?HPI ? ? ?Prior to Admission medications   ?Medication Sig Start Date End Date Taking? Authorizing Provider  ?MULTIPLE VITAMIN PO Take by mouth.   Yes [provider]  ?valACYclovir (VALTREX) 500 MG tablet Take one tablet daily ?Patient not taking: Reported on 01/06/2022 05/06/16   Terrance Mass, MD  ? ? ?Allergies  ?Allergen Reactions  ?? Penicillins Swelling  ?? Sulfa Antibiotics Itching  ?? Latex Itching and Rash  ?  Rash and itching  At times.   ?? Macrobid [Nitrofurantoin Monohyd Macro] Rash  ?? Other Rash  ? ? ?Patient Active Problem List  ? Diagnosis Date Noted  ?? History of ELISA positive for HSV 10/30/2014  ?? Adjustment disorder with depressed mood 10/30/2014  ?? H/O vitamin D deficiency 04/07/2012  ?? History of anemia 04/07/2012  ?? Hypercholesterolemia 04/07/2012  ? ? ?Past Medical History:  ?Diagnosis Date  ?? Anemia   ?? Depression   ?? H. pylori infection 11/26/2008  ?? High cholesterol   ?? History of kidney stones 11/2005  ?? 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 ? ?History reviewed. No pertinent surgical history. ? ?Social History  ? ?Socioeconomic History  ?? Marital status: Legally Separated  ?  Spouse name: Not on file  ?? Number of children: Not on file  ?? Years of education: Not on file  ?? Highest education level: Not on file  ?Occupational History  ?? Not on file  ?Tobacco Use  ??  Smoking status: Never  ?? Smokeless tobacco: Never  ?Substance and Sexual Activity  ?? Alcohol use: Yes  ?  Alcohol/week: 0.0 standard drinks  ?  Comment: occ  ?? Drug use: No  ?? Sexual activity: Yes  ?  Birth control/protection: None  ?  Comment: 1st intercourse- 23, partners- greater than 5  ?Other Topics Concern  ?? Not on file  ?Social History Narrative  ?? Not on file  ? ?Social Determinants of Health  ? ?Financial Resource Strain: Not on file  ?Food Insecurity: Not on file  ?Transportation Needs: Not on file  ?Physical Activity: Not on file  ?Stress: Not on file  ?Social Connections: Not on file  ?Intimate Partner Violence: Not on file  ? ? ?Family History  ?Problem Relation Age of Onset  ?? Hypertension Mother   ?? Cancer Mother   ?     lung  ?? Heart disease Brother   ?? Cancer Brother   ?     sarcoma  ?? Diabetes Father   ?? Heart disease Father   ?? Lupus Father   ? ? ? ?Review of Systems  ?Constitutional: Negative.  Negative for chills and fever.  ?HENT: Negative.  Negative for congestion and sore throat.   ?Eyes: Negative.   ?Respiratory: Negative.  Negative for cough and shortness of breath.   ?Cardiovascular: Negative.  Negative for chest pain  and palpitations.  ?Gastrointestinal: Negative.  Negative for abdominal pain, diarrhea, nausea and vomiting.  ?Genitourinary: Negative.  Negative for dysuria and hematuria.  ?Skin: Negative.  Negative for rash.  ?Neurological: Negative.  Negative for dizziness and headaches.  ?All other systems reviewed and are negative. ?Today's Vitals  ? 01/06/22 1502  ?BP: 112/60  ?Pulse: 65  ?Temp: 98 ?F (36.7 ?C)  ?TempSrc: Temporal  ?SpO2: 100%  ?Weight: 134 lb 6.4 oz (61 kg)  ?Height: '5\' 1"'$  (1.549 m)  ? ?Body mass index is 25.39 kg/m?. ? ? ?Physical Exam ?Vitals reviewed.  ?Constitutional:   ?   Appearance: Normal appearance.  ?HENT:  ?   Head: Normocephalic.  ?   Right Ear: Tympanic membrane, ear canal and external ear normal.  ?   Left Ear: Tympanic membrane, ear canal  and external ear normal.  ?   Mouth/Throat:  ?   Mouth: Mucous membranes are moist.  ?   Pharynx: Oropharynx is clear.  ?Eyes:  ?   Extraocular Movements: Extraocular movements intact.  ?   Conjunctiva/sclera: Conjunctivae normal.  ?   Pupils: Pupils are equal, round, and reactive to light.  ?Neck:  ?   Vascular: No carotid bruit.  ?Cardiovascular:  ?   Rate and Rhythm: Normal rate and regular rhythm.  ?   Pulses: Normal pulses.  ?   Heart sounds: Normal heart sounds.  ?Pulmonary:  ?   Effort: Pulmonary effort is normal.  ?   Breath sounds: Normal breath sounds.  ?Abdominal:  ?   General: Bowel sounds are normal. There is no distension.  ?   Palpations: Abdomen is soft.  ?   Tenderness: There is no abdominal tenderness.  ?Musculoskeletal:     ?   General: Normal range of motion.  ?   Cervical back: No tenderness.  ?   Right lower leg: No edema.  ?   Left lower leg: No edema.  ?Lymphadenopathy:  ?   Cervical: No cervical adenopathy.  ?Skin: ?   General: Skin is warm and dry.  ?   Capillary Refill: Capillary refill takes less than 2 seconds.  ?Neurological:  ?   General: No focal deficit present.  ?   Mental Status: She is alert and oriented to person, place, and time.  ?Psychiatric:     ?   Mood and Affect: Mood normal.     ?   Behavior: Behavior normal.  ? ? ? ?ASSESSMENT & PLAN: ?Problem List Items Addressed This Visit   ?None ?Visit Diagnoses   ? ? Routine general medical examination at a health care facility    -  Primary  ? Visit for screening mammogram      ? Relevant Orders  ? Mammogram Digital Screening  ? Colon cancer screening      ? Relevant Orders  ? Ambulatory referral to Gastroenterology  ? Screening for deficiency anemia      ? Relevant Orders  ? CBC with Differential  ? Screening for lipoid disorders      ? Relevant Orders  ? Lipid panel  ? Screening for endocrine, metabolic and immunity disorder      ? Relevant Orders  ? Comprehensive metabolic panel  ? Hemoglobin A1c  ? TSH  ? Vitamin B12  ? VITAMIN  D 25 Hydroxy (Vit-D Deficiency, Fractures)  ? Urinalysis  ? Encounter to establish care      ? ?  ? ?Modifiable risk factors discussed with patient. ?Anticipatory guidance according to  age provided. ?The following topics were also discussed: ?Social Determinants of Health ?Smoking.  Non-smoker ?Diet and nutrition ?Benefits of exercise ?Cancer screening and need for breast and colon cancer screening with mammogram and colonoscopy. ?Vaccinations recommendations ?Cardiovascular risk assessment and need for blood work ?Mental health including depression and anxiety ?Fall and accident prevention ? ?Patient Instructions  ?Mantenimiento de [employer] mujeres ?Health Maintenance, Female ?Adoptar un estilo de vida saludable y recibir atenci?n preventiva son importantes para promover la salud y Musician. Consulte al m?dico sobre: ?El esquema adecuado para [place] pruebas y ex?menes peri?dicos. ?Cosas que puede hacer por su cuenta para prevenir enfermedades y [place] sano. ??Qu? debo saber sobre la dieta, el peso y el ejercicio? ?Consuma una dieta saludable ? ?Consuma una dieta que incluya muchas verduras, frutas, productos l?cteos con bajo contenido de grasa y prote?nas magras. ?No consuma muchos alimentos ricos en grasas s?lidas, az?cares agregados o sodio. ?Mantenga un peso saludable ?El ?ndice de masa muscular [facility]) se [place] para identificar problemas de peso. Proporciona una estimaci?n de la grasa corporal bas?ndose en el peso y la altura. Su m?dico puede ayudarle a Radiation protection practitioner [place] y a Scientist, forensic o Theatre manager un peso saludable. ?Haga ejercicio con regularidad ?Haga ejercicio con regularidad. Esta es una de las pr?cticas m?s importantes que puede hacer por su salud. La mayor?a de los adultos deben seguir estas pautas: ?Realizar, al menos, 150 minutos de [place] f?sica por semana. El ejercicio debe aumentar la frecuencia card?aca y Nature conservation officer transpirar (ejercicio de intensidad moderada). ?Hacer ejercicios de  fortalecimiento por lo [employer] por semana. Agregue esto a su plan de ejercicio de intensidad moderada. ?Pase menos tiempo sentada. Incluso la actividad f?sica ligera puede ser beneficiosa. ?Controle su

## 2022-01-07 LAB — URINALYSIS
Bilirubin Urine: NEGATIVE
Ketones, ur: NEGATIVE
Leukocytes,Ua: NEGATIVE
Nitrite: NEGATIVE
Specific Gravity, Urine: 1.01 (ref 1.000–1.030)
Total Protein, Urine: NEGATIVE
Urine Glucose: NEGATIVE
Urobilinogen, UA: 0.2 (ref 0.0–1.0)
pH: 6 (ref 5.0–8.0)

## 2022-02-17 ENCOUNTER — Encounter: Payer: Self-pay | Admitting: Family Medicine

## 2022-02-17 ENCOUNTER — Ambulatory Visit: Payer: Managed Care, Other (non HMO) | Admitting: Family Medicine

## 2022-02-17 ENCOUNTER — Ambulatory Visit
Admission: RE | Admit: 2022-02-17 | Discharge: 2022-02-17 | Disposition: A | Discharge status: Home / Self Care | Payer: Managed Care, Other (non HMO) | Source: Ambulatory Visit | Attending: Emergency Medicine | Admitting: Emergency Medicine

## 2022-02-17 VITALS — BP 112/62 | HR 64 | Temp 97.8°F | Ht 61.0 in | Wt 136.0 lb

## 2022-02-17 DIAGNOSIS — Z1231 Encounter for screening mammogram for malignant neoplasm of breast: Secondary | ICD-10-CM

## 2022-02-17 DIAGNOSIS — M542 Cervicalgia: Secondary | ICD-10-CM | POA: Diagnosis not present

## 2022-02-17 DIAGNOSIS — G8929 Other chronic pain: Secondary | ICD-10-CM

## 2022-02-17 DIAGNOSIS — M545 Low back pain, unspecified: Secondary | ICD-10-CM | POA: Diagnosis not present

## 2022-02-17 DIAGNOSIS — N393 Stress incontinence (female) (male): Secondary | ICD-10-CM | POA: Diagnosis not present

## 2022-02-17 DIAGNOSIS — R8290 Unspecified abnormal findings in urine: Secondary | ICD-10-CM | POA: Insufficient documentation

## 2022-02-17 LAB — POCT URINALYSIS DIPSTICK
Bilirubin, UA: NEGATIVE
Blood, UA: NEGATIVE
Glucose, UA: NEGATIVE
Ketones, UA: NEGATIVE
Leukocytes, UA: NEGATIVE
Nitrite, UA: NEGATIVE
Protein, UA: NEGATIVE
Spec Grav, UA: 1.015 (ref 1.010–1.025)
Urobilinogen, UA: 0.2 E.U./dL
pH, UA: 7 (ref 5.0–8.0)

## 2022-02-17 NOTE — Assessment & Plan Note (Addendum)
Exam unremarkable today. Reassured patient that her back pain appears to be MSK related and not kidney related. UA negative. No sign of renal stone. No sign of infectious process. Declines imaging.  Referral to PT for further exam and treatment per patient request.

## 2022-02-17 NOTE — Progress Notes (Signed)
Subjective:     Patient ID: Heather Navarro, female    DOB: July 16, 1971, 51 y.o.   MRN: [id number]  Chief Complaint  Patient presents with   Flank Pain    Lower back pain she believes is due to her kidneys, would like referral to urologist to make sure kidneys are alright. Has had hx of kidney stones twice.  Every time she goes to the bathroom she reports a smell.    Flank Pain  Patient is in today for malodorous urine and low back pain.  Complains of intermittent urinary frequency, urgency and occasional incontinence, mainly with jumping.   Hx of renal stones in 2008 and 2009.  No hematuria.   Back pain is with bending over and other movements.  Neck pain with certain movements. Both issues have been ongoing for years. Worse recently. Not radiating.  Is not using heat or ice. No pain medications.   Reports hx of abnormal alignment of her spine. Has been to PT for shoulders in the past.   Denies fever, chills, dizziness, chest pain, palpitations, shortness of breath, abdominal pain, N/V/D, LE edema.    States she has a family history of cancer. Lung in brother and mother. Father with prostate cancer. Tearful when discussing family members.     Health Maintenance Due  Topic Date Due   TETANUS/TDAP  Never done   Zoster Vaccines- Shingrix (1 of 2) Never done   COLONOSCOPY (Pts 45-52yr Insurance coverage will need to be confirmed)  Never done   MAMMOGRAM  10/07/2016    Past Medical History:  Diagnosis Date   Anemia    Depression    H. 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 No pertinent surgical history.  Family History  Problem Relation Age of Onset   Hypertension Mother    Cancer Mother        lung   Diabetes Father    Heart disease Father    Lupus Father    Heart disease Brother    Cancer Brother        sarcoma   Breast cancer Neg Hx      Social History   Socioeconomic History   Marital status: Married    Spouse name: Not on file   Number of children: Not on file   Years of education: Not on file   Highest education level: Not on file  Occupational History   Not on file  Tobacco Use   Smoking status: Never   Smokeless tobacco: Never  Substance and Sexual Activity   Alcohol use: Yes    Alcohol/week: 0.0 standard drinks    Comment: occ   Drug use: No   Sexual activity: Yes    Birth control/protection: None    Comment: 1st intercourse- 23, partners- greater than 5  Other Topics Concern   Not on file  Social History Narrative   Not on file   Social Determinants of Health   Financial Resource Strain: Not on file  Food Insecurity: Not on file  Transportation Needs: Not on file  Physical Activity: Not on file  Stress: Not on file  Social Connections: Not on file  Intimate Partner Violence: Not on file    Outpatient Medications Prior to Visit  Medication Sig Dispense Refill   MULTIPLE VITAMIN PO Take by mouth.     valACYclovir (VALTREX) 500 MG tablet  Take one tablet daily (Patient not taking: Reported on 02/17/2022) 30 tablet 12   No facility-administered medications prior to visit.    Allergies  Allergen Reactions   Penicillins Swelling   Sulfa Antibiotics Itching   Latex Itching and Rash    Rash and itching  At times.    Macrobid [Nitrofurantoin Monohyd Macro] Rash   Other Rash    Review of Systems  Genitourinary:  Positive for flank pain.  Pertinent positives and negatives in the history of present illness.     Objective:    Physical Exam Constitutional:      General: She is not in acute distress.    Appearance: She is not ill-appearing or diaphoretic.  Cardiovascular:     Rate and Rhythm: Normal rate and regular rhythm.  Pulmonary:     Effort: Pulmonary effort is normal.     Breath sounds: Normal breath sounds.  Abdominal:     General: Bowel sounds are normal. There is no  distension.     Palpations: Abdomen is soft.     Tenderness: There is no abdominal tenderness. There is no right CVA tenderness, left CVA tenderness or guarding.  Musculoskeletal:        General: Normal range of motion.     Cervical back: Normal range of motion and neck supple. No tenderness.  Lymphadenopathy:     Cervical: No cervical adenopathy.  Skin:    General: Skin is warm and dry.  Neurological:     General: No focal deficit present.     Mental Status: She is alert and oriented to person, place, and time.     Sensory: No sensory deficit.     Motor: No weakness.     Gait: Gait normal.  Psychiatric:        Mood and Affect: Mood normal.        Thought Content: Thought content normal.    BP 112/62 (BP Location: Left Arm, Patient Position: Sitting, Cuff Size: Large)   Pulse 64   Temp 97.8 F (36.6 C) (Temporal)   Ht '5\' 1"'$  (1.549 m)   Wt 136 lb (61.7 kg)   SpO2 99%   BMI 25.70 kg/m  Wt Readings from Last 3 Encounters:  02/17/22 136 lb (61.7 kg)  01/06/22 134 lb 6.4 oz (61 kg)  05/06/16 137 lb (62.1 kg)       Assessment & Plan:   Problem List Items Addressed This Visit       Other   Abnormal urine odor    UA negative today. Encourage good hydration        Relevant Orders   POCT urinalysis dipstick (Completed)   Chronic bilateral low back pain without sciatica - Primary    Exam unremarkable today. Reassured patient that her back pain appears to be MSK related and not kidney related. UA negative. No sign of renal stone. No sign of infectious process. Declines imaging.  Referral to PT for further exam and treatment per patient request.        Relevant Orders   Ambulatory referral to Physical Therapy   Chronic neck pain    Exam unremarkable today. Referral to PT for further exam and treatment per patient request.        Relevant Orders   Ambulatory referral to Physical Therapy   Stress incontinence    Reports not that bothersome. Recommend emptying  bladder prior to exercise.         I am having Heather Petitti "Mili" maintain  her valACYclovir and MULTIPLE VITAMIN PO.  No orders of the defined types were placed in this encounter.

## 2022-02-17 NOTE — Assessment & Plan Note (Signed)
UA negative today. Encourage good hydration

## 2022-02-17 NOTE — Assessment & Plan Note (Signed)
Reports not that bothersome. Recommend emptying bladder prior to exercise.

## 2022-02-17 NOTE — Assessment & Plan Note (Signed)
Exam unremarkable today. Referral to PT for further exam and treatment per patient request.

## 2022-03-05 ENCOUNTER — Encounter: Payer: Self-pay | Admitting: Internal Medicine

## 2022-03-05 ENCOUNTER — Ambulatory Visit: Payer: Managed Care, Other (non HMO) | Admitting: Internal Medicine

## 2022-03-05 VITALS — BP 110/72 | HR 56 | Temp 98.3°F | Ht 61.0 in | Wt 132.0 lb

## 2022-03-05 DIAGNOSIS — R739 Hyperglycemia, unspecified: Secondary | ICD-10-CM

## 2022-03-05 DIAGNOSIS — F4321 Adjustment disorder with depressed mood: Secondary | ICD-10-CM

## 2022-03-05 DIAGNOSIS — R1011 Right upper quadrant pain: Secondary | ICD-10-CM | POA: Insufficient documentation

## 2022-03-05 DIAGNOSIS — R109 Unspecified abdominal pain: Secondary | ICD-10-CM | POA: Insufficient documentation

## 2022-03-05 DIAGNOSIS — R509 Fever, unspecified: Secondary | ICD-10-CM

## 2022-03-05 LAB — HEPATIC FUNCTION PANEL
ALT: 16 U/L (ref 0–35)
AST: 15 U/L (ref 0–37)
Albumin: 4.3 g/dL (ref 3.5–5.2)
Alkaline Phosphatase: 98 U/L (ref 39–117)
Bilirubin, Direct: 0.1 mg/dL (ref 0.0–0.3)
Total Bilirubin: 0.5 mg/dL (ref 0.2–1.2)
Total Protein: 7.4 g/dL (ref 6.0–8.3)

## 2022-03-05 LAB — BASIC METABOLIC PANEL
BUN: 13 mg/dL (ref 6–23)
CO2: 29 mEq/L (ref 19–32)
Calcium: 10.9 mg/dL — ABNORMAL HIGH (ref 8.4–10.5)
Chloride: 107 mEq/L (ref 96–112)
Creatinine, Ser: 0.65 mg/dL (ref 0.40–1.20)
GFR: 101.05 mL/min (ref >60.00)
Glucose, Bld: 97 mg/dL (ref 70–99)
Potassium: 4.5 mEq/L (ref 3.5–5.1)
Sodium: 142 mEq/L (ref 135–145)

## 2022-03-05 LAB — CBC WITH DIFFERENTIAL/PLATELET
Basophils Absolute: 0 10*3/uL (ref 0.0–0.1)
Basophils Relative: 0.3 % (ref 0.0–3.0)
Eosinophils Absolute: 0.1 10*3/uL (ref 0.0–0.7)
Eosinophils Relative: 1.7 % (ref 0.0–5.0)
HCT: 36.8 % (ref 36.0–46.0)
Hemoglobin: 12 g/dL (ref 12.0–15.0)
Lymphocytes Relative: 32.1 % (ref 12.0–46.0)
Lymphs Abs: 1.2 10*3/uL (ref 0.7–4.0)
MCHC: 32.7 g/dL (ref 30.0–36.0)
MCV: 83.2 fl (ref 78.0–100.0)
Monocytes Absolute: 0.3 10*3/uL (ref 0.1–1.0)
Monocytes Relative: 8.9 % (ref 3.0–12.0)
Neutro Abs: 2.1 10*3/uL (ref 1.4–7.7)
Neutrophils Relative %: 57 % (ref 43.0–77.0)
Platelets: 262 10*3/uL (ref 150.0–400.0)
RBC: 4.42 Mil/uL (ref 3.87–5.11)
RDW: 14.6 % (ref 11.5–15.5)
WBC: 3.7 10*3/uL — ABNORMAL LOW (ref 4.0–10.5)

## 2022-03-05 LAB — URINALYSIS, ROUTINE W REFLEX MICROSCOPIC
Bilirubin Urine: NEGATIVE
Hgb urine dipstick: NEGATIVE
Ketones, ur: NEGATIVE
Leukocytes,Ua: NEGATIVE
Nitrite: NEGATIVE
RBC / HPF: NONE SEEN (ref 0–?)
Specific Gravity, Urine: 1.015 (ref 1.000–1.030)
Total Protein, Urine: NEGATIVE
Urine Glucose: NEGATIVE
Urobilinogen, UA: 0.2 (ref 0.0–1.0)
pH: 6.5 (ref 5.0–8.0)

## 2022-03-05 LAB — LIPASE: Lipase: 17 U/L (ref 11.0–59.0)

## 2022-03-05 LAB — HEMOGLOBIN A1C: Hgb A1c MFr Bld: 6.1 % (ref 4.6–6.5)

## 2022-03-05 NOTE — Assessment & Plan Note (Signed)
Also cant r/o renal stone as she has hx of stones twice in past - for CT as above

## 2022-03-05 NOTE — Assessment & Plan Note (Signed)
I suspect symptomatic GB vs renal stone issue - for CT renal stat, also labs as ordered including cr, LFTs, UA.  Delcines other pain med for now except mobic prn,  Work note given.

## 2022-03-05 NOTE — Progress Notes (Signed)
Patient ID: Heather Navarro, female   DOB: [birth year], 51 y.o.   MRN: [id number]         Chief Complaint: right flank and RUQ pain for 2 days       HPI:  Heather Navarro is a 51 y.o. female here with c/o acute onset pain difficult to localize but at least clearly to the ruq and right side/back/flank area x 2 days. Started mild, but has been severe at times, almost went to ED, sharp, advil may have helped slightly, was better yesterday am, but then worse again last PM, nothing else seems to make better or worse.   Works for [employer] for [employer] position, mostly desk work - no heavy lifting, and. Not usually very active overall, but last sat played volleyball with friends and felt good, no pain the next day even.  Denies urinary symptoms such as dysuria, frequency, urgency, flank pain, hematuria or n/v, fever, chills, though has felt warm at times. Denies worsening reflux, dysphagia, vomiting, bowel change or blood, except has had more belching even with water in last 2 days.  Pt very concerned about this as well bc planning to leave for [place] to see ailing father, sad and tearful about this today as it may be the last time to see him before he passes.   Wt Readings from Last 3 Encounters:  03/05/22 132 lb (59.9 kg)  02/17/22 136 lb (61.7 kg)  01/06/22 134 lb 6.4 oz (61 kg)   BP Readings from Last 3 Encounters:  03/05/22 110/72  02/17/22 112/62  01/06/22 112/60         Past Medical History:  Diagnosis Date   Anemia    Depression    H. 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 No pertinent surgical history.  reports that she has never smoked. She has never used smokeless tobacco. She reports current alcohol use. She reports that she does not use drugs. family history includes Cancer in her brother and mother; Diabetes in her father; Heart disease in her brother and father;  Hypertension in her mother; Lupus in her father. Allergies  Allergen Reactions   Penicillins Swelling   Sulfa Antibiotics Itching   Latex Itching and Rash    Rash and itching  At times.    Macrobid [Nitrofurantoin Monohyd Macro] Rash   Other Rash   Current Outpatient Medications on File Prior to Visit  Medication Sig Dispense Refill   MULTIPLE VITAMIN PO Take by mouth.     valACYclovir (VALTREX) 500 MG tablet Take one tablet daily (Patient not taking: Reported on 03/05/2022) 30 tablet 12   No current facility-administered medications on file prior to visit.        ROS:  All others reviewed and negative.  Objective        PE:  BP 110/72 (BP Location: Left Arm, Patient Position: Sitting, Cuff Size: Normal)   Pulse (!) 56   Temp 98.3 F (36.8 C) (Oral)   Ht '5\' 1"'$  (1.549 m)   Wt 132 lb (59.9 kg)   SpO2 99%   BMI 24.94 kg/m                 Constitutional: Pt appears in NAD               HENT: Head: NCAT.  Right Ear: External ear normal.                 Left Ear: External ear normal.                Eyes: . Pupils are equal, round, and reactive to light. Conjunctivae and EOM are normal               Nose: without d/c or deformity               Neck: Neck supple. Gross normal ROM               Cardiovascular: Normal rate and regular rhythm.                 Pulmonary/Chest: Effort normal and breath sounds without rales or wheezing.                Abd:  Soft, mod tender epigastric and RUQ, ND, + BS, no organomegaly, no flank tender; has some full bladder currently but not overly tender it seems               Neurological: Pt is alert. At baseline orientation, motor grossly intact               Skin: Skin is warm. No rashes, no other new lesions, LE edema - none               Psychiatric: Pt behavior is normal without agitation   Micro: none  Cardiac tracings I have personally interpreted today:  none  Pertinent Radiological findings (summarize): none   Lab Results   Component Value Date   WBC 5.3 01/06/2022   HGB 11.9 (L) 01/06/2022   HCT 36.5 01/06/2022   PLT 273.0 01/06/2022   GLUCOSE 87 01/06/2022   CHOL 266 (H) 01/06/2022   TRIG 153.0 (H) 01/06/2022   HDL 62.80 01/06/2022   LDLCALC 173 (H) 01/06/2022   ALT 25 01/06/2022   AST 23 01/06/2022   NA 139 01/06/2022   K 4.1 01/06/2022   CL 104 01/06/2022   CREATININE 0.61 01/06/2022   BUN 11 01/06/2022   CO2 28 01/06/2022   TSH 3.30 01/06/2022   HGBA1C 6.5 01/06/2022   Assessment/Plan:  Heather Navarro is a 51 y.o. Other or two or more races [6] female with  has a past medical history of Anemia, Depression, H. pylori infection (11/26/2008), High cholesterol, History of kidney stones (11/2005), and HSV-2 (herpes simplex virus 2) infection (01/2007).  RUQ pain I suspect symptomatic GB vs renal stone issue - for CT renal stat, also labs as ordered including cr, LFTs, UA.  Delcines other pain med for now except mobic prn,  Work note given.   Right flank pain Also cant r/o renal stone as she has hx of stones twice in past - for CT as above  Adjustment disorder with depressed mood Mild today, and sad regarding father illness, hopeful to get her health condition improved prior to leaving for [place] in about 2 wks.   Declines counseling referral or other change in tx  Followup: Return if symptoms worsen or fail to improve.  Cathlean Cower, MD 03/05/2022 10:16 AM [facility]

## 2022-03-05 NOTE — Assessment & Plan Note (Signed)
Mild today, and sad regarding father illness, hopeful to get her health condition improved prior to leaving for [place] in about 2 wks.   Declines counseling referral or other change in tx

## 2022-03-05 NOTE — Patient Instructions (Signed)
You are given the work note for today and tomorrow  Please continue all other medications as before  Please have the pharmacy call with any other refills you may need.  Please keep your appointments with your specialists as you may have planned  You will be contacted regarding the referral for: CT abd/pelvis primarily to check for kidney stone, but also for Gallbladder  Please go to the LAB at the blood drawing area for the tests to be done  You will be contacted by phone if any changes need to be made immediately.  Otherwise, you will receive a letter about your results with an explanation, but please check with MyChart first.  Please remember to sign up for MyChart if you have not done so, as this will be important to you in the future with finding out test results, communicating by private email, and scheduling acute appointments online when needed.

## 2022-03-06 LAB — URINE CULTURE: Result:: NO GROWTH
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
## 2022-03-12 ENCOUNTER — Other Ambulatory Visit: Payer: Self-pay

## 2022-03-12 ENCOUNTER — Encounter: Payer: Self-pay | Admitting: Physical Therapy

## 2022-03-12 ENCOUNTER — Ambulatory Visit: Payer: Managed Care, Other (non HMO) | Attending: Family Medicine | Admitting: Physical Therapy

## 2022-03-12 ENCOUNTER — Telehealth: Payer: Self-pay | Admitting: Emergency Medicine

## 2022-03-12 DIAGNOSIS — R252 Cramp and spasm: Secondary | ICD-10-CM | POA: Diagnosis present

## 2022-03-12 DIAGNOSIS — M5416 Radiculopathy, lumbar region: Secondary | ICD-10-CM | POA: Insufficient documentation

## 2022-03-12 DIAGNOSIS — M5459 Other low back pain: Secondary | ICD-10-CM | POA: Insufficient documentation

## 2022-03-12 DIAGNOSIS — M5412 Radiculopathy, cervical region: Secondary | ICD-10-CM | POA: Insufficient documentation

## 2022-03-12 DIAGNOSIS — R293 Abnormal posture: Secondary | ICD-10-CM | POA: Insufficient documentation

## 2022-03-12 DIAGNOSIS — M542 Cervicalgia: Secondary | ICD-10-CM | POA: Insufficient documentation

## 2022-03-12 DIAGNOSIS — M545 Low back pain, unspecified: Secondary | ICD-10-CM | POA: Insufficient documentation

## 2022-03-12 DIAGNOSIS — G8929 Other chronic pain: Secondary | ICD-10-CM | POA: Diagnosis not present

## 2022-03-12 DIAGNOSIS — M6281 Muscle weakness (generalized): Secondary | ICD-10-CM | POA: Diagnosis present

## 2022-03-12 NOTE — Telephone Encounter (Signed)
PT calls today in request of status on their recent referral. I had let her know that it looked like the status was unchanged and that they would be getting back up with her.   PT also would like to speak with Dr.Sagardia or a CMA regarding recent lab results. PT states she does see the results on my chart but would like to have someone with a clinical background go over these results with her.  CB: [phone]

## 2022-03-16 NOTE — Telephone Encounter (Signed)
Called patient and left message for patient to call office for lab results.

## 2022-03-17 ENCOUNTER — Ambulatory Visit: Payer: Managed Care, Other (non HMO) | Admitting: Physical Therapy

## 2022-04-06 ENCOUNTER — Ambulatory Visit: Payer: Managed Care, Other (non HMO) | Admitting: Physical Therapy

## 2022-04-14 ENCOUNTER — Ambulatory Visit: Payer: Managed Care, Other (non HMO) | Attending: Family Medicine

## 2022-04-14 DIAGNOSIS — M5459 Other low back pain: Secondary | ICD-10-CM | POA: Insufficient documentation

## 2022-04-14 DIAGNOSIS — M5412 Radiculopathy, cervical region: Secondary | ICD-10-CM | POA: Insufficient documentation

## 2022-04-14 DIAGNOSIS — M5416 Radiculopathy, lumbar region: Secondary | ICD-10-CM | POA: Insufficient documentation

## 2022-04-14 DIAGNOSIS — M542 Cervicalgia: Secondary | ICD-10-CM | POA: Diagnosis present

## 2022-04-14 DIAGNOSIS — M6281 Muscle weakness (generalized): Secondary | ICD-10-CM | POA: Insufficient documentation

## 2022-04-14 DIAGNOSIS — R293 Abnormal posture: Secondary | ICD-10-CM | POA: Insufficient documentation

## 2022-04-14 DIAGNOSIS — R252 Cramp and spasm: Secondary | ICD-10-CM | POA: Insufficient documentation
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
## 2022-04-17 ENCOUNTER — Encounter: Payer: Self-pay | Admitting: Emergency Medicine

## 2022-04-21 ENCOUNTER — Ambulatory Visit: Payer: Managed Care, Other (non HMO) | Admitting: Physical Therapy

## 2022-05-05 ENCOUNTER — Encounter: Payer: BLUE CROSS/BLUE SHIELD | Admitting: Physical Therapy

## 2022-05-19 ENCOUNTER — Encounter: Payer: BLUE CROSS/BLUE SHIELD | Admitting: Physical Therapy

## 2022-05-21 ENCOUNTER — Encounter: Payer: Self-pay | Admitting: Emergency Medicine

## 2022-05-21 ENCOUNTER — Ambulatory Visit: Payer: Managed Care, Other (non HMO) | Admitting: Emergency Medicine

## 2022-05-21 VITALS — BP 120/62 | HR 55 | Temp 98.4°F | Ht 61.0 in | Wt 130.5 lb

## 2022-05-21 DIAGNOSIS — A6000 Herpesviral infection of urogenital system, unspecified: Secondary | ICD-10-CM | POA: Diagnosis not present

## 2022-05-21 DIAGNOSIS — Z23 Encounter for immunization: Secondary | ICD-10-CM

## 2022-05-21 DIAGNOSIS — R1013 Epigastric pain: Secondary | ICD-10-CM | POA: Diagnosis not present

## 2022-05-21 LAB — CBC WITH DIFFERENTIAL/PLATELET
Basophils Absolute: 0 10*3/uL (ref 0.0–0.1)
Basophils Relative: 0.3 % (ref 0.0–3.0)
Eosinophils Absolute: 0.1 10*3/uL (ref 0.0–0.7)
Eosinophils Relative: 2.1 % (ref 0.0–5.0)
HCT: 35.6 % — ABNORMAL LOW (ref 36.0–46.0)
Hemoglobin: 11.5 g/dL — ABNORMAL LOW (ref 12.0–15.0)
Lymphocytes Relative: 34 % (ref 12.0–46.0)
Lymphs Abs: 1.8 10*3/uL (ref 0.7–4.0)
MCHC: 32.1 g/dL (ref 30.0–36.0)
MCV: 83.2 fl (ref 78.0–100.0)
Monocytes Absolute: 0.3 10*3/uL (ref 0.1–1.0)
Monocytes Relative: 6.5 % (ref 3.0–12.0)
Neutro Abs: 3 10*3/uL (ref 1.4–7.7)
Neutrophils Relative %: 57.1 % (ref 43.0–77.0)
Platelets: 268 10*3/uL (ref 150.0–400.0)
RBC: 4.28 Mil/uL (ref 3.87–5.11)
RDW: 14 % (ref 11.5–15.5)
WBC: 5.2 10*3/uL (ref 4.0–10.5)

## 2022-05-21 MED ORDER — PANTOPRAZOLE SODIUM 40 MG PO TBEC: 40.0000 mg | DELAYED_RELEASE_TABLET | Freq: Every day | ORAL | 3 refills | Status: DC

## 2022-05-21 MED ORDER — ACYCLOVIR 5 % EX CREA: 1.0000 | TOPICAL_CREAM | CUTANEOUS | 0 refills | Status: DC

## 2022-05-21 MED ORDER — VALACYCLOVIR HCL 500 MG PO TABS: ORAL_TABLET | ORAL | 12 refills | Status: DC

## 2022-05-21 NOTE — Patient Instructions (Signed)
Gastritis en adultos Gastritis, Adult La gastritis es irritacin e hinchazn (inflamacin) del estmago. Hay dos tipos de gastritis: Gastritis aguda. Este tipo se desarrolla rpidamente. Gastritis crnica. Este tipo es mucho ms frecuente. Se desarrolla lentamente y dura un largo tiempo. Es importante recibir ayuda para Personnel officer. Si no obtiene [place], Nurse, children's y Chief Executive Officer (lceras) en el [employer]. Cules son las causas? Esta afeccin puede ser causada por lo siguiente: Grmenes que llegan al [employer] y provocan una infeccin. Beber alcohol en exceso. Los medicamentos que toma. Tener demasiado cido [employer]. Tener una enfermedad del estmago. Algunas otras causas son las siguientes: Nurse, mental health. Algunos tratamientos para el cncer (radiacin). Fumar cigarrillos o usar productos que contienen nicotina o tabaco. En algunos casos, se desconoce la causa de esta afeccin. Qu incrementa el riesgo? Tener una enfermedad de los intestinos. Tener enfermedad de Crohn. Usar aspirina o ibuprofeno y otros antiinflamatorios no esteroideos (AINE) para tratar otras afecciones. Estrs. Cules son los signos o sntomas? Dolor en Product manager. Una sensacin de Science writer. Sensacin de que va a vomitar (nuseas). Vmitos o vmitos con sangre. Sensacin de estar demasiado lleno luego de comer. Prdida de peso. Mal aliento. Sangre en las heces (deposiciones). En algunos casos, no hay sntomas. Cmo se trata? Esta afeccin se trata con medicamentos. Los medicamentos que se usan dependen de lo que provoc la afeccin. Es posible que le administren lo siguiente: Antibiticos, si la causa de la afeccin fue una infeccin provocada por grmenes. Bloqueadores H2 y medicamentos similares, si la afeccin fue causada por una gran cantidad de cido en el estmago. El tratamiento tambin puede incluir interrumpir el uso de ciertos medicamentos,  como aspirina o ibuprofeno. Siga estas instrucciones en su casa: Medicamentos Use los medicamentos de venta libre y los recetados solamente como se lo haya indicado el mdico. Si le recetaron un antibitico, tmelo como se lo haya indicado el mdico. No deje de tomarlo aunque comience a sentirse mejor. Consumo de alcohol No beba alcohol si: El mdico le indica que no lo haga. Est embarazada, puede estar embarazada o est tratando de [place]. Si bebe alcohol: Limite su uso a las siguientes medidas: De 0 a 1 medida por da para las mujeres. De 0 a 2 medidas por da para los hombres. Sepa cunta cantidad de alcohol hay en las bebidas que toma. En los [place], una medida equivale a una botella de cerveza de 12 oz (355 ml), un vaso de vino de 5 oz (148 ml) o un vaso de una bebida alcohlica de alta graduacin de 1 oz (44 ml). Instrucciones generales  Haga comidas pequeas y frecuentes en lugar de comidas abundantes. Evite los alimentos y las bebidas que lo Conservation officer, nature. Beba suficiente lquido para Contractor pis (la orina) de color amarillo plido. Converse con el mdico sobre maneras de Scientific laboratory technician. Puede realizar ejercicios de respiracin profunda, meditacin o yoga. No fume ni consuma ningn producto que contenga nicotina o tabaco. Si necesita ayuda para dejar de fumar, consulte al mdico. Concurra a todas las visitas de seguimiento. Comunquese con un mdico si: Sus sntomas empeoran. El dolor de [place]. Los sntomas desaparecen y Teacher, adult education. Tiene fiebre. Solicite ayuda de inmediato si: Vomita sangre o una sustancia parecida a los granos de [place]. La materia fecal es negra o de color rojo oscuro. Vomita cada vez que intenta tomar lquidos. Estos sntomas pueden Sales executive. Solicite ayuda de inmediato.  Comunquese con el servicio de emergencias de su localidad (911 en los [place]). No espere a ver si los sntomas  desaparecen. No conduzca por sus propios medios [employer] hospital. Resumen La gastritis es irritacin e hinchazn (inflamacin) del estmago. Debe obtener ayuda para tratar esta afeccin. Si no obtiene [place], Nurse, children's y Chief Executive Officer (lceras) en el [employer]. Puede recibir tratamiento con medicamentos para los grmenes o medicamentos para bloquear la presencia de demasiada cantidad de cido Nurse, adult. Esta informacin no tiene Marine scientist el consejo del mdico. Asegrese de hacerle al mdico cualquier pregunta que tenga. Document Revised: 02/13/2021 Document Reviewed: 02/13/2021 Elsevier Patient Education  [place].

## 2022-05-21 NOTE — Assessment & Plan Note (Signed)
Last flareup last month. Prescription for valacyclovir and Zovirax sent to pharmacy of record.

## 2022-05-21 NOTE — Assessment & Plan Note (Signed)
Diet and nutrition discussed. Recommend to start pantoprazole 40 mg daily. Needs GI evaluation and possible upper endoscopy. GI referral placed today.

## 2022-05-21 NOTE — Progress Notes (Signed)
Heather Navarro 51 y.o.   Chief Complaint  Patient presents with   Abdominal Pain    X 1 week, severe sharp upper abd pain    Referral    Referral for colonoscopy ,  nutritionist    HISTORY OF PRESENT ILLNESS: This is a 51 y.o. female A1A complaining of chronic upper abdominal/epigastric pain but worse in the last several weeks.  Burning sensation in epigastric area with occasional nausea.  Denies melena. Still able to eat and drink.  No vomiting.  Stress contributing to symptoms. Needs referral to GI doctor. Also has history of recurrent genital herpes.  Requesting prescription for valacyclovir and Zovirax cream. No other complaints or medical concerns today.  Abdominal Pain Associated symptoms include nausea. Pertinent negatives include no diarrhea, dysuria, fever, hematuria, melena or vomiting.     Prior to Admission medications   Medication Sig Start Date End Date Taking? Authorizing Provider  acyclovir cream (ZOVIRAX) 5 % Apply 1 Application topically every 3 (three) hours. 05/21/22  Yes Amyria Komar, Ines Bloomer, MD  pantoprazole (PROTONIX) 40 MG tablet Take 1 tablet (40 mg total) by mouth daily. 05/21/22  Yes Ellise Kovack, Ines Bloomer, MD  MULTIPLE VITAMIN PO Take by mouth. Patient not taking: Reported on 03/12/2022    [provider]  valACYclovir (VALTREX) 500 MG tablet Take one tablet daily 05/21/22   Horald Pollen, MD    Allergies  Allergen Reactions   Penicillins Swelling   Sulfa Antibiotics Itching   Latex Itching and Rash    Rash and itching  At times.    Macrobid [Nitrofurantoin Monohyd Macro] Rash   Other Rash    Patient Active Problem List   Diagnosis Date Noted   Right flank pain 03/05/2022   RUQ pain 03/05/2022   Stress incontinence 02/17/2022   Abnormal urine odor 02/17/2022   Chronic bilateral low back pain without sciatica 02/17/2022   Chronic neck pain 02/17/2022   Epistaxis 11/06/2020   Tinnitus of left ear 11/06/2020   Head injury  10/02/2020   History of coma 10/02/2020   Syncope and collapse 10/02/2020   History of ELISA positive for HSV 10/30/2014   Adjustment disorder with depressed mood 10/30/2014   H/O vitamin D deficiency 04/07/2012   History of anemia 04/07/2012   Hypercholesterolemia 04/07/2012    Past Medical History:  Diagnosis Date   Anemia    Depression    H. 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  Social History   Socioeconomic History   Marital status: Married    Spouse name: Not on file   Number of children: Not on file   Years of education: Not on file   Highest education level: Not on file  Occupational History   Not on file  Tobacco Use   Smoking status: Never   Smokeless tobacco: Never  Substance and Sexual Activity   Alcohol use: Yes    Alcohol/week: 0.0 standard drinks of alcohol    Comment: occ   Drug use: No   Sexual activity: Yes    Birth control/protection: None    Comment: 1st intercourse- 23, partners- greater than 5  Other Topics Concern   Not on file  Social History Narrative   Not on file   Social Determinants of Health   Financial Resource Strain: Not on file  Food Insecurity: Not on file  Transportation Needs: Not on file  Physical Activity: Not on file  Stress: Not on file  Social Connections: Not on file  Intimate Partner Violence: Not on file    Family History  Problem Relation Age of Onset   Hypertension Mother    Cancer Mother        lung   Diabetes Father    Heart disease Father    Lupus Father    Heart disease Brother    Cancer Brother        sarcoma   Breast cancer Neg Hx      Review of Systems  Constitutional: Negative.  Negative for chills and fever.  HENT: Negative.  Negative for congestion and sore throat.   Respiratory: Negative.  Negative for cough and shortness of breath.   Cardiovascular:  Negative.  Negative for chest pain and palpitations.  Gastrointestinal:  Positive for abdominal pain, heartburn and nausea. Negative for blood in stool, diarrhea, melena and vomiting.  Genitourinary: Negative.  Negative for dysuria and hematuria.  Skin: Negative.  Negative for rash.  All other systems reviewed and are negative.  Today's Vitals   05/21/22 1549  BP: 120/62  Pulse: (!) 55  Temp: 98.4 F (36.9 C)  TempSrc: Oral  SpO2: 98%  Weight: 130 lb 8 oz (59.2 kg)  Height: '5\' 1"'$  (1.549 m)   Body mass index is 24.66 kg/m.   Physical Exam Vitals reviewed.  Constitutional:      Appearance: She is well-developed.  HENT:     Head: Normocephalic.     Mouth/Throat:     Mouth: Mucous membranes are moist.     Pharynx: Oropharynx is clear.  Eyes:     Extraocular Movements: Extraocular movements intact.     Conjunctiva/sclera: Conjunctivae normal.     Pupils: Pupils are equal, round, and reactive to light.  Cardiovascular:     Rate and Rhythm: Normal rate and regular rhythm.     Pulses: Normal pulses.     Heart sounds: Normal heart sounds.  Pulmonary:     Effort: Pulmonary effort is normal.     Breath sounds: Normal breath sounds.  Abdominal:     General: There is no distension.     Palpations: Abdomen is soft.     Tenderness: There is no abdominal tenderness. There is no guarding.  Musculoskeletal:        General: Normal range of motion.     Cervical back: No tenderness.  Lymphadenopathy:     Cervical: No cervical adenopathy.  Skin:    General: Skin is warm and dry.     Capillary Refill: Capillary refill takes less than 2 seconds.  Neurological:     General: No focal deficit present.     Mental Status: She is alert and oriented to person, place, and time.  Psychiatric:        Mood and Affect: Mood normal.        Behavior: Behavior normal.      ASSESSMENT & PLAN: A total of 43 minutes was spent with the patient and counseling/coordination of care regarding  preparing for this visit, review of most recent office visit notes, review of most recent blood work results, differential diagnosis of dyspepsia, treatment with PPIs, need for GI evaluation and possible upper endoscopy, education on nutrition, prognosis, documentation, need for follow-up.  Problem List Items Addressed This Visit       Genitourinary   Recurrent genital herpes    Last flareup last month. Prescription for  valacyclovir and Zovirax sent to pharmacy of record.      Relevant Medications   valACYclovir (VALTREX) 500 MG tablet   acyclovir cream (ZOVIRAX) 5 %     Other   Dyspepsia - Primary    Diet and nutrition discussed. Recommend to start pantoprazole 40 mg daily. Needs GI evaluation and possible upper endoscopy. GI referral placed today.      Relevant Medications   pantoprazole (PROTONIX) 40 MG tablet   Other Relevant Orders   Ambulatory referral to Gastroenterology   CBC with Differential/Platelet   Comprehensive metabolic panel   Lipase   Other Visit Diagnoses     Need for vaccination       Relevant Orders   Tdap vaccine greater than or equal to 7yo IM (Completed)      Patient Instructions  Gastritis en adultos Gastritis, Adult La gastritis es irritacin e hinchazn (inflamacin) del estmago. Hay dos tipos de gastritis: Gastritis aguda. Este tipo se desarrolla rpidamente. Gastritis crnica. Este tipo es mucho ms frecuente. Se desarrolla lentamente y dura un largo tiempo. Es importante recibir ayuda para Personnel officer. Si no obtiene [place], Nurse, children's y Chief Executive Officer (lceras) en el [employer]. Cules son las causas? Esta afeccin puede ser causada por lo siguiente: Grmenes que llegan al [employer] y provocan una infeccin. Beber alcohol en exceso. Los medicamentos que toma. Tener demasiado cido [employer]. Tener una enfermedad del estmago. Algunas otras causas son las siguientes: Nurse, mental health. Algunos  tratamientos para el cncer (radiacin). Fumar cigarrillos o usar productos que contienen nicotina o tabaco. En algunos casos, se desconoce la causa de esta afeccin. Qu incrementa el riesgo? Tener una enfermedad de los intestinos. Tener enfermedad de Crohn. Usar aspirina o ibuprofeno y otros antiinflamatorios no esteroideos (AINE) para tratar otras afecciones. Estrs. Cules son los signos o sntomas? Dolor en Product manager. Una sensacin de Science writer. Sensacin de que va a vomitar (nuseas). Vmitos o vmitos con sangre. Sensacin de estar demasiado lleno luego de comer. Prdida de peso. Mal aliento. Sangre en las heces (deposiciones). En algunos casos, no hay sntomas. Cmo se trata? Esta afeccin se trata con medicamentos. Los medicamentos que se usan dependen de lo que provoc la afeccin. Es posible que le administren lo siguiente: Antibiticos, si la causa de la afeccin fue una infeccin provocada por grmenes. Bloqueadores H2 y medicamentos similares, si la afeccin fue causada por una gran cantidad de cido en el estmago. El tratamiento tambin puede incluir interrumpir el uso de ciertos medicamentos, como aspirina o ibuprofeno. Siga estas instrucciones en su casa: Medicamentos Use los medicamentos de venta libre y los recetados solamente como se lo haya indicado el mdico. Si le recetaron un antibitico, tmelo como se lo haya indicado el mdico. No deje de tomarlo aunque comience a sentirse mejor. Consumo de alcohol No beba alcohol si: El mdico le indica que no lo haga. Est embarazada, puede estar embarazada o est tratando de [place]. Si bebe alcohol: Limite su uso a las siguientes medidas: De 0 a 1 medida por da para las mujeres. De 0 a 2 medidas por da para los hombres. Sepa cunta cantidad de alcohol hay en las bebidas que toma. En los [place], una medida equivale a una botella de cerveza de 12 oz (355 ml), un vaso de vino de 5 oz  (148 ml) o un vaso de una bebida alcohlica de alta graduacin de 1 oz (44 ml). Instrucciones generales  Haga comidas  pequeas y frecuentes en lugar de comidas abundantes. Evite los alimentos y las bebidas que lo Conservation officer, nature. Beba suficiente lquido para Contractor pis (la orina) de color amarillo plido. Converse con el mdico sobre maneras de Scientific laboratory technician. Puede realizar ejercicios de respiracin profunda, meditacin o yoga. No fume ni consuma ningn producto que contenga nicotina o tabaco. Si necesita ayuda para dejar de fumar, consulte al mdico. Concurra a todas las visitas de seguimiento. Comunquese con un mdico si: Sus sntomas empeoran. El dolor de [place]. Los sntomas desaparecen y Teacher, adult education. Tiene fiebre. Solicite ayuda de inmediato si: Vomita sangre o una sustancia parecida a los granos de [place]. La materia fecal es negra o de color rojo oscuro. Vomita cada vez que intenta tomar lquidos. Estos sntomas pueden Sales executive. Solicite ayuda de inmediato. Comunquese con el servicio de emergencias de su localidad (911 en los [place]). No espere a ver si los sntomas desaparecen. No conduzca por sus propios medios [employer] hospital. Resumen La gastritis es irritacin e hinchazn (inflamacin) del estmago. Debe obtener ayuda para tratar esta afeccin. Si no obtiene [place], Nurse, children's y Chief Executive Officer (lceras) en el [employer]. Puede recibir tratamiento con medicamentos para los grmenes o medicamentos para bloquear la presencia de demasiada cantidad de cido Nurse, adult. Esta informacin no tiene Marine scientist el consejo del mdico. Asegrese de hacerle al mdico cualquier pregunta que tenga. Document Revised: 02/13/2021 Document Reviewed: 02/13/2021 Elsevier Patient Education  Brice, MD [facility]

## 2022-05-22 ENCOUNTER — Telehealth: Payer: Self-pay | Admitting: *Deleted

## 2022-05-22 LAB — COMPREHENSIVE METABOLIC PANEL
ALT: 15 U/L (ref 0–35)
AST: 15 U/L (ref 0–37)
Albumin: 4.2 g/dL (ref 3.5–5.2)
Alkaline Phosphatase: 97 U/L (ref 39–117)
BUN: 12 mg/dL (ref 6–23)
CO2: 26 mEq/L (ref 19–32)
Calcium: 10.3 mg/dL (ref 8.4–10.5)
Chloride: 103 mEq/L (ref 96–112)
Creatinine, Ser: 0.67 mg/dL (ref 0.40–1.20)
GFR: 100.16 mL/min (ref >60.00)
Glucose, Bld: 87 mg/dL (ref 70–99)
Potassium: 3.9 mEq/L (ref 3.5–5.1)
Sodium: 138 mEq/L (ref 135–145)
Total Bilirubin: 0.4 mg/dL (ref 0.2–1.2)
Total Protein: 7.1 g/dL (ref 6.0–8.3)

## 2022-05-22 LAB — LIPASE: Lipase: 16 U/L (ref 11.0–59.0)

## 2022-05-22 NOTE — Telephone Encounter (Signed)
PA for acyclovir submitted, awaiting response Key: [id number]

## 2022-06-05 NOTE — Telephone Encounter (Signed)
Acyclovir cream  PA denied

## 2022-07-23 IMAGING — MG MM DIGITAL SCREENING BILAT W/ TOMO AND CAD
8 series · 8 of 24 positions shown · non-contrast
Comparison: Previous exam(s).

CLINICAL DATA: Screening.

EXAM:
DIGITAL SCREENING BILATERAL MAMMOGRAM WITH TOMOSYNTHESIS AND CAD
TECHNIQUE: Bilateral screening digital craniocaudal and mediolateral oblique
mammograms were obtained. Bilateral screening digital breast
tomosynthesis was performed. The images were evaluated with
computer-aided detection.

[R CC synth-2D]
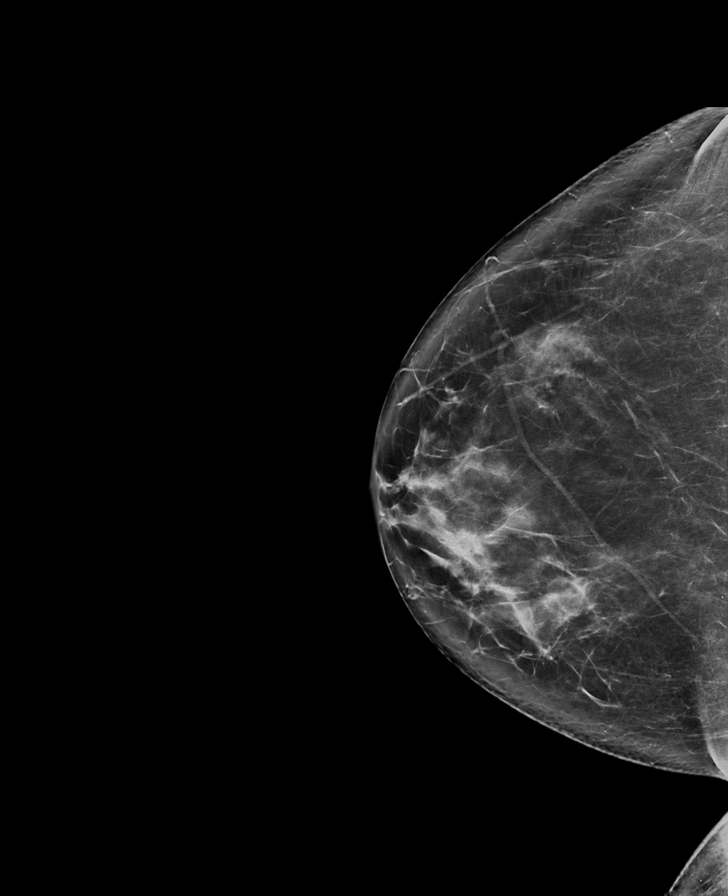

[L MLO synth-2D]
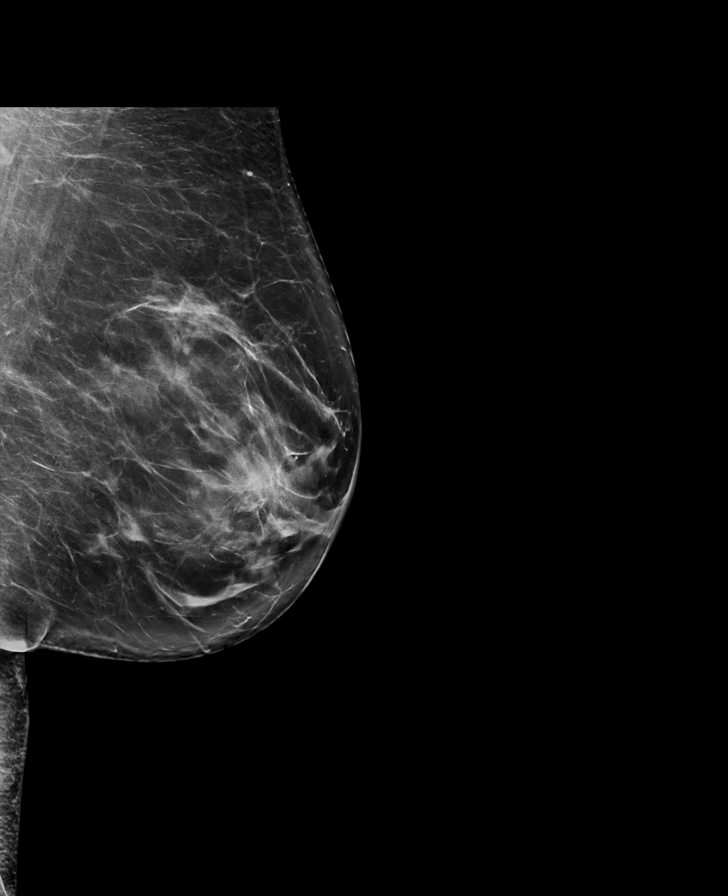

[L CC synth-2D]
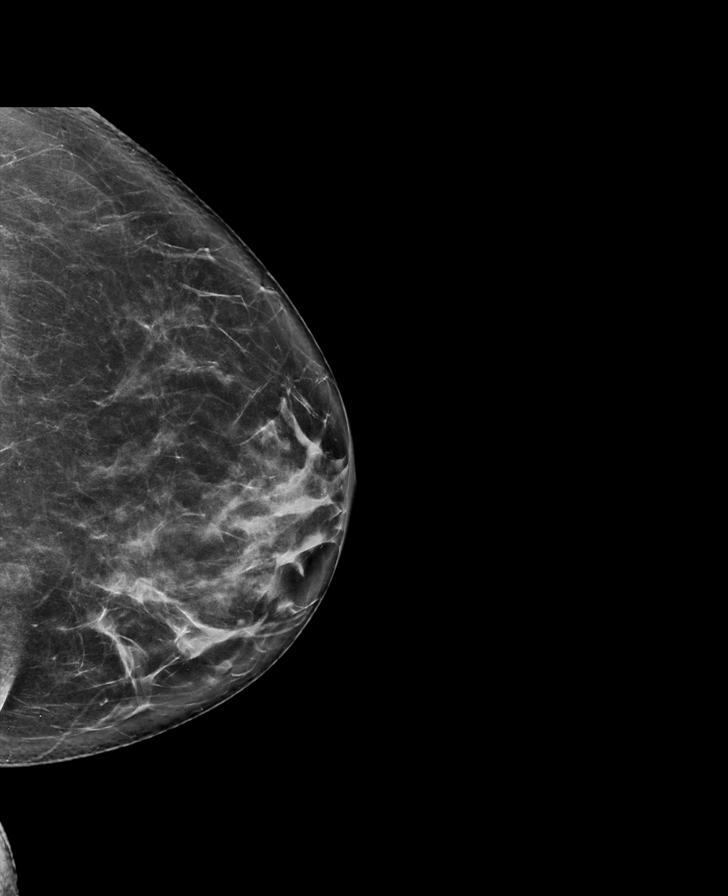

[R MLO synth-2D]
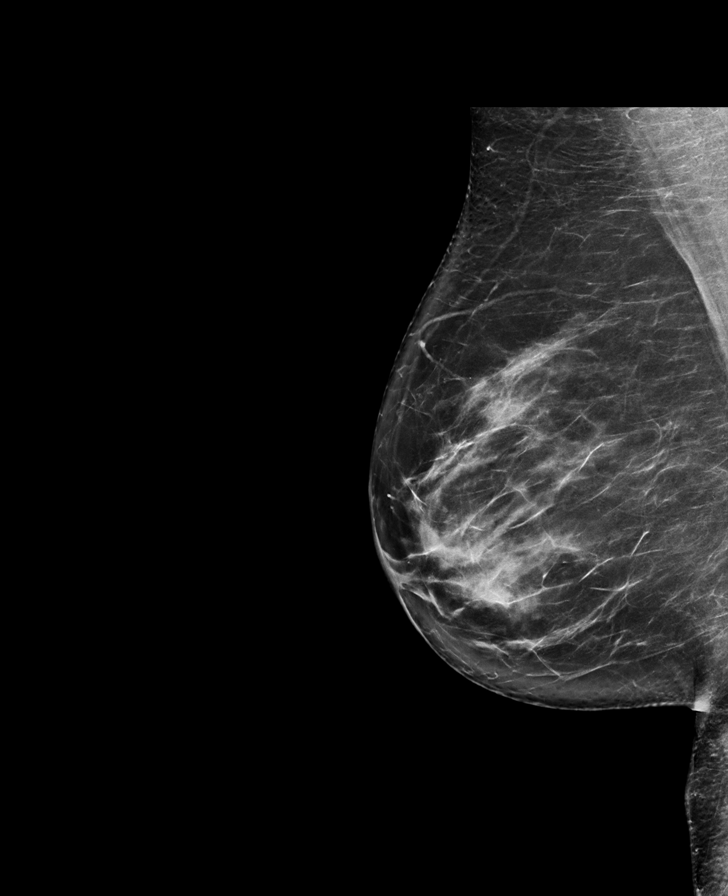

[R MLO tomo · tomo slice 46/91.0]
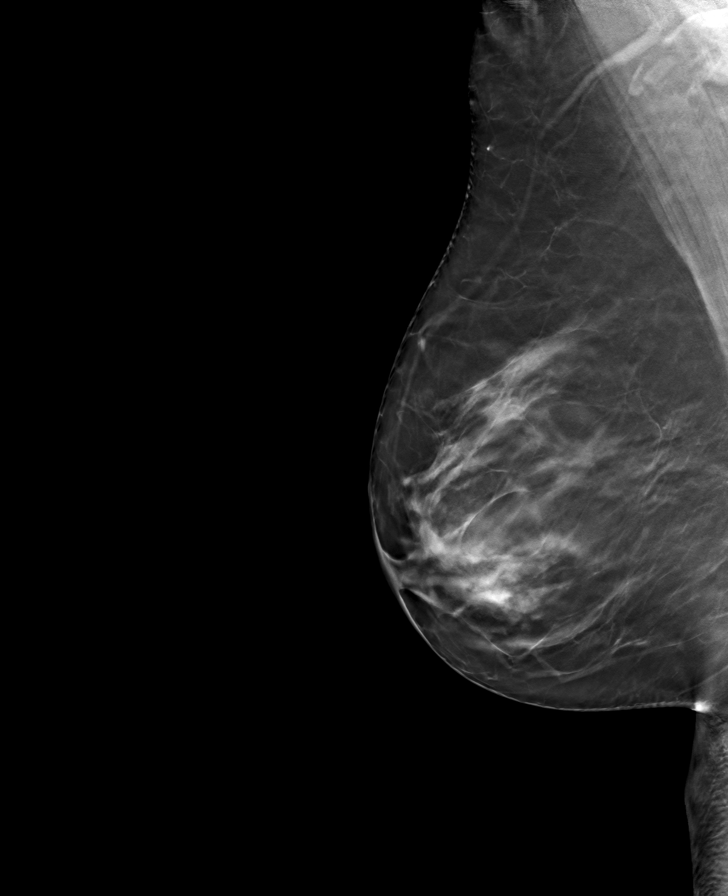

[L CC tomo · tomo slice 43/84.0]
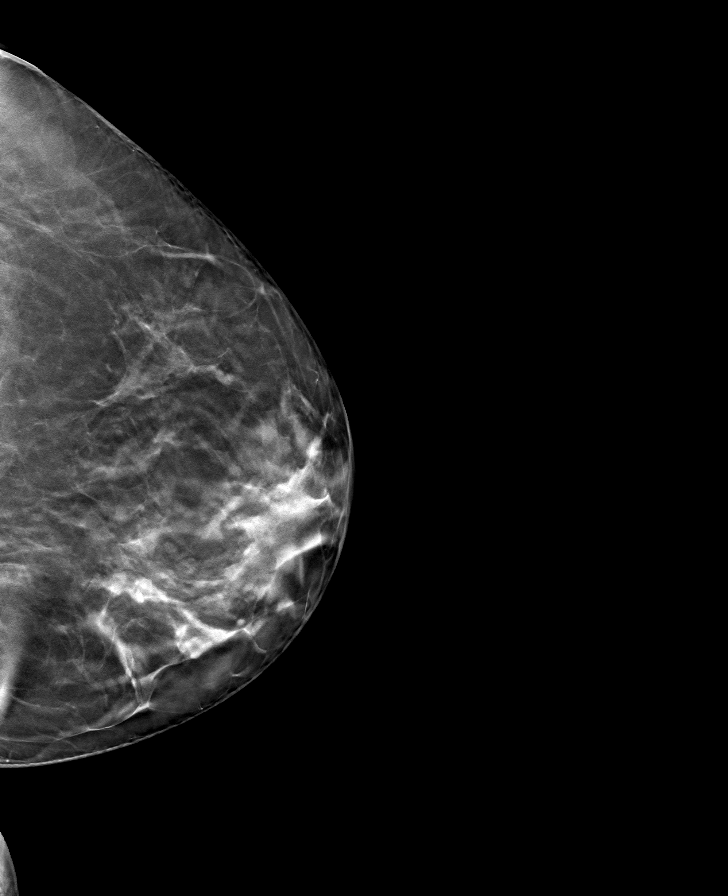

[L MLO tomo · tomo slice 47/93.0]
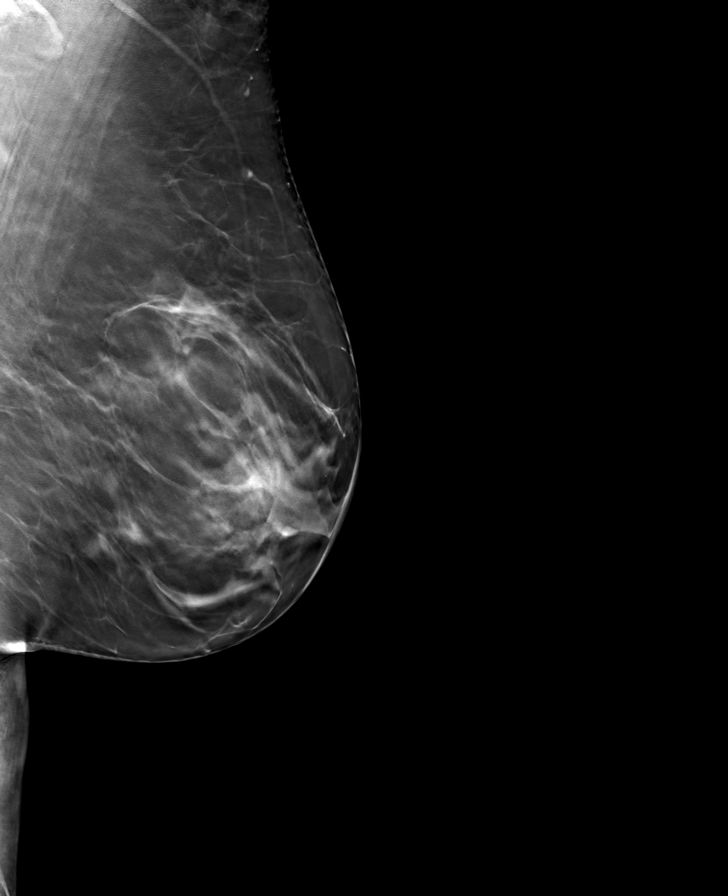

[R CC tomo · tomo slice 43/86.0]
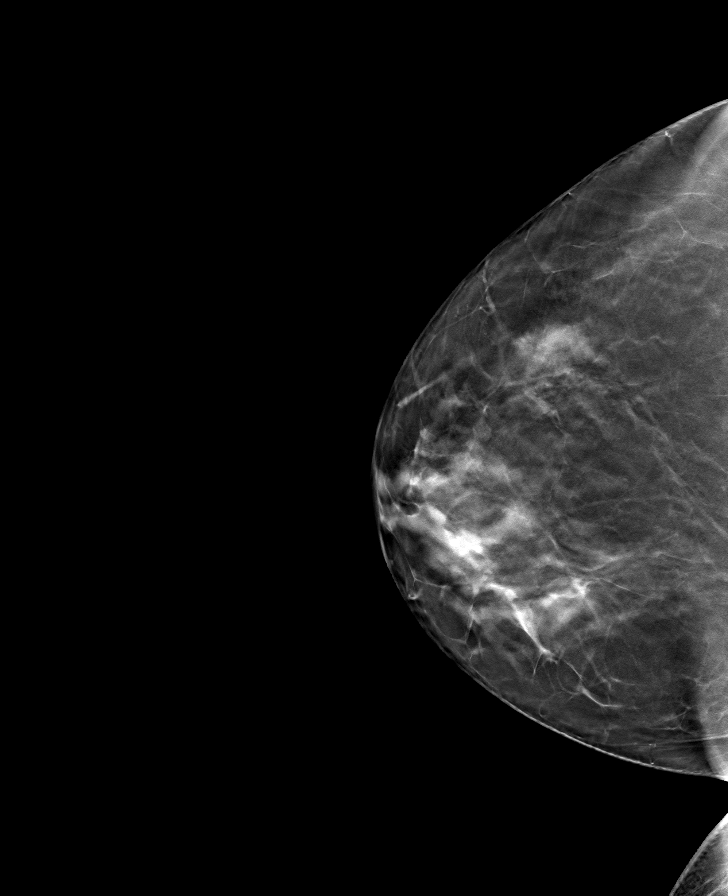

[8 of 24 positions shown; findings below may reference images not displayed]

ACR Breast Density Category c: The breast tissue is heterogeneously
dense, which may obscure small masses.
FINDINGS: There are no findings suspicious for malignancy.
IMPRESSION: No mammographic evidence of malignancy. A result letter of this
screening mammogram will be mailed directly to the patient.

RECOMMENDATION:
Screening mammogram in one year. (Code:Q3-W-BC3)

BI-RADS CATEGORY  1: Negative.

## 2022-08-16 ENCOUNTER — Other Ambulatory Visit: Payer: Self-pay | Admitting: Emergency Medicine

## 2022-08-16 DIAGNOSIS — R1013 Epigastric pain: Secondary | ICD-10-CM

## 2022-08-18 ENCOUNTER — Ambulatory Visit: Payer: Managed Care, Other (non HMO) | Admitting: Emergency Medicine

## 2022-08-25 ENCOUNTER — Other Ambulatory Visit: Payer: Self-pay | Admitting: Emergency Medicine

## 2022-08-25 ENCOUNTER — Encounter: Payer: Self-pay | Admitting: Emergency Medicine

## 2022-08-25 ENCOUNTER — Ambulatory Visit: Payer: Managed Care, Other (non HMO) | Admitting: Emergency Medicine

## 2022-08-25 VITALS — BP 122/68 | HR 58 | Temp 98.7°F | Ht 61.0 in | Wt 135.0 lb

## 2022-08-25 DIAGNOSIS — Z1211 Encounter for screening for malignant neoplasm of colon: Secondary | ICD-10-CM

## 2022-08-25 DIAGNOSIS — F4323 Adjustment disorder with mixed anxiety and depressed mood: Secondary | ICD-10-CM | POA: Diagnosis not present

## 2022-08-25 DIAGNOSIS — N644 Mastodynia: Secondary | ICD-10-CM

## 2022-08-25 DIAGNOSIS — R1030 Lower abdominal pain, unspecified: Secondary | ICD-10-CM | POA: Diagnosis not present

## 2022-08-25 NOTE — Progress Notes (Signed)
Heather Navarro 51 y.o.   Chief Complaint  Patient presents with   Breast Pain    Left breast pain under her armpit, x 1 week    Abdominal Pain    Abd pain after eating x 2 -3 weeks     HISTORY OF PRESENT ILLNESS: This is a 51 y.o. female complaining of left breast pain for 1 week.  Slowly getting better. Normal mammogram May 2023 Also complaining of intermittent lower abdominal pain with changes in her bowel movements for the past several months.  Needs colonoscopy. Also complaining of depression.  Crying a lot.  Does not want medication.  Requesting referral to therapist.  Triggers are family situations. No other comp pain or medical concerns today.  Abdominal Pain Associated symptoms include constipation and diarrhea. Pertinent negatives include no fever, headaches, melena, nausea or vomiting.     Prior to Admission medications   Medication Sig Start Date End Date Taking? Authorizing Provider  acyclovir cream (ZOVIRAX) 5 % Apply 1 Application topically every 3 (three) hours. 05/21/22  Yes Horald Pollen, MD  pantoprazole (PROTONIX) 40 MG tablet TAKE 1 TABLET BY MOUTH EVERY DAY 08/17/22  Yes Merel Santoli, Ines Bloomer, MD  valACYclovir (VALTREX) 500 MG tablet Take one tablet daily 05/21/22  Yes Mathilde Mcwherter, Ines Bloomer, MD    Allergies  Allergen Reactions   Penicillins Swelling   Sulfa Antibiotics Itching   Latex Itching and Rash    Rash and itching  At times.    Macrobid [Nitrofurantoin Monohyd Macro] Rash   Other Rash    Patient Active Problem List   Diagnosis Date Noted   Recurrent genital herpes 05/21/2022   Dyspepsia 05/21/2022   Stress incontinence 02/17/2022   Chronic bilateral low back pain without sciatica 02/17/2022   Chronic neck pain 02/17/2022   Tinnitus of left ear 11/06/2020   History of coma 10/02/2020   History of ELISA positive for HSV 10/30/2014   Adjustment disorder with depressed mood 10/30/2014   H/O vitamin D deficiency 04/07/2012   History  of anemia 04/07/2012   Hypercholesterolemia 04/07/2012    Past Medical History:  Diagnosis Date   Anemia    Depression    H. 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  Social History   Socioeconomic History   Marital status: Married    Spouse name: Not on file   Number of children: Not on file   Years of education: Not on file   Highest education level: Not on file  Occupational History   Not on file  Tobacco Use   Smoking status: Never   Smokeless tobacco: Never  Substance and Sexual Activity   Alcohol use: Yes    Alcohol/week: 0.0 standard drinks of alcohol    Comment: occ   Drug use: No   Sexual activity: Yes    Birth control/protection: None    Comment: 1st intercourse- 23, partners- greater than 5  Other Topics Concern   Not on file  Social History Narrative   Not on file   Social Determinants of Health   Financial Resource Strain: Not on file  Food Insecurity: Not on file  Transportation Needs: Not on file  Physical Activity: Not on file  Stress: Not on file  Social Connections: Not on file  Intimate Partner Violence: Not on file    Family History  Problem  Relation Age of Onset   Hypertension Mother    Cancer Mother        lung   Diabetes Father    Heart disease Father    Lupus Father    Heart disease Brother    Cancer Brother        sarcoma   Breast cancer Neg Hx      Review of Systems  Constitutional: Negative.  Negative for chills and fever.  HENT: Negative.  Negative for congestion and sore throat.   Respiratory: Negative.  Negative for cough and shortness of breath.   Cardiovascular: Negative.  Negative for chest pain and palpitations.  Gastrointestinal:  Positive for abdominal pain, constipation and diarrhea. Negative for blood in stool, melena, nausea and vomiting.  Skin: Negative.  Negative for rash.   Neurological: Negative.  Negative for dizziness and headaches.  Psychiatric/Behavioral:  Positive for depression.   All other systems reviewed and are negative.  Today's Vitals   08/25/22 0848  BP: 122/68  Pulse: (!) 58  Temp: 98.7 F (37.1 C)  TempSrc: Oral  SpO2: 98%  Weight: 135 lb (61.2 kg)  Height: '5\' 1"'$  (1.549 m)   Body mass index is 25.51 kg/m.   Physical Exam Vitals reviewed.  Constitutional:      Appearance: She is well-developed.  HENT:     Head: Normocephalic.  Eyes:     Extraocular Movements: Extraocular movements intact.  Cardiovascular:     Rate and Rhythm: Normal rate and regular rhythm.  Pulmonary:     Effort: Pulmonary effort is normal.     Breath sounds: Normal breath sounds.  Abdominal:     Palpations: Abdomen is soft.     Tenderness: There is no abdominal tenderness.  Musculoskeletal:        General: Normal range of motion.  Skin:    General: Skin is warm and dry.     Capillary Refill: Capillary refill takes less than 2 seconds.  Neurological:     General: No focal deficit present.     Mental Status: She is alert and oriented to person, place, and time.  Psychiatric:        Mood and Affect: Mood normal.        Behavior: Behavior normal.      ASSESSMENT & PLAN: A total of 48 minutes was spent with the patient and counseling/coordination of care regarding preparing for this visit, review of most recent office visit notes, review of multiple chronic medical problems and their management, review of all medications, differential diagnosis of lower abdominal pain and need for colonoscopy, depression/anxiety issues and need for psychiatric evaluation and possible treatment, need for diagnostic left-sided mammogram, education on nutrition, prognosis, and need for follow-up.  Problem List Items Addressed This Visit       Other   Lower abdominal pain - Primary    Recurrent lower abdominal cramps with changes in bowel movements. Differential  diagnosis discussed with patient. Clinically stable with benign abdominal exam. Needs colonoscopy. Referral placed today.      Breast pain, left    Normal screening mammogram last May. Needs diagnostic left-sided mammogram Referral placed today.      Relevant Orders   MM Digital Diagnostic Unilat L   Situational mixed anxiety and depressive disorder    Active and affecting quality of life. Aware of triggers. Does not want medication at present time. Wants to talk to a therapist. Referral placed today.      Relevant Orders   Ambulatory  referral to Psychiatry   Other Visit Diagnoses     Colon cancer screening          Patient Instructions  Dolor abdominal en los adultos Abdominal Pain, Adult El dolor de estmago (abdominal) puede tener muchas causas. [place] veces, el dolor de [place] no es peligroso. Muchos de [employer] de dolor de estmago pueden controlarse y tratarse en casa. Sin embargo, a Clinical cytogeneticist, Conservation officer, historic buildings de [place] es grave. El mdico intentar descubrir la causa del dolor de [place]. Siga estas instrucciones en su casa:  Medicamentos [employer] de venta libre y los recetados solamente como se lo haya indicado el mdico. No tome medicamentos que lo ayuden a Landscape architect (laxantes), salvo que el mdico se lo indique. Instrucciones generales Est atento al dolor de estmago para Actuary cambio. Beba suficiente lquido para Contractor pis (la orina) de color amarillo plido. Concurra a todas las visitas de seguimiento como se lo haya indicado el mdico. Esto es importante. Comunquese con un mdico si: El dolor de estmago cambia o [place]. No tiene apetito o baja de peso sin proponrselo. Tiene dificultades para defecar (est estreido) o heces lquidas (diarrea) durante ms de 2 o 3 das. Siente dolor al orinar o defecar. El dolor de estmago lo despierta de noche. El dolor empeora con las comidas, despus de comer o con  determinados alimentos. Tiene vmitos y no puede retener nada de lo que ingiere. Tiene fiebre. Observa sangre en la orina. Solicite ayuda de inmediato si: El dolor no desaparece en el tiempo indicado por el mdico. No puede dejar de vomitar. Siente dolor solamente en zonas especficas del abdomen, como el lado derecho o la parte inferior izquierda. Tiene heces con sangre, de color negro o con aspecto alquitranado. Tiene dolor muy intenso en el vientre, clicos o meteorismo. Presenta signos de no tener suficientes lquidos o agua en el cuerpo (deshidratacin), por ejemplo: [place], muy escasa o falta de orina. Labios agrietados. Sequedad de boca. Ojos hundidos. Somnolencia. Debilidad. Tiene dificultad para respirar o Tourist information centre manager. Resumen Muchos de [employer] de dolor de estmago pueden controlarse y tratarse en casa. Est atento al dolor de estmago para Actuary cambio. [place] los medicamentos de venta libre y los recetados solamente como se lo haya indicado el mdico. Comunquese con un mdico si el dolor de estmago cambia o [place]. [place] ayuda de inmediato si tiene dolor muy intenso en el vientre, clicos o meteorismo. Esta informacin no tiene Marine scientist el consejo del mdico. Asegrese de hacerle al mdico cualquier pregunta que tenga. Document Revised: 03/22/2019 Document Reviewed: 03/22/2019 Elsevier Patient Education  Valentine, MD [facility]

## 2022-08-25 NOTE — Assessment & Plan Note (Signed)
Normal screening mammogram last May. Needs diagnostic left-sided mammogram Referral placed today.

## 2022-08-25 NOTE — Patient Instructions (Signed)
Dolor abdominal en los adultos Abdominal Pain, Adult El dolor de estmago (abdominal) puede tener muchas causas. La mayora de las veces, el dolor de estmago no es peligroso. Muchos de estos casos de dolor de estmago pueden controlarse y tratarse en casa. Sin embargo, a veces, el dolor de estmago es grave. El mdico intentar descubrir la causa del dolor de estmago. Siga estas instrucciones en su casa:  Medicamentos Tome los medicamentos de venta libre y los recetados solamente como se lo haya indicado el mdico. No tome medicamentos que lo ayuden a defecar (laxantes), salvo que el mdico se lo indique. Instrucciones generales Est atento al dolor de estmago para detectar cualquier cambio. Beba suficiente lquido para mantener el pis (la orina) de color amarillo plido. Concurra a todas las visitas de seguimiento como se lo haya indicado el mdico. Esto es importante. Comunquese con un mdico si: El dolor de estmago cambia o empeora. No tiene apetito o baja de peso sin proponrselo. Tiene dificultades para defecar (est estreido) o heces lquidas (diarrea) durante ms de 2 o 3 das. Siente dolor al orinar o defecar. El dolor de estmago lo despierta de noche. El dolor empeora con las comidas, despus de comer o con determinados alimentos. Tiene vmitos y no puede retener nada de lo que ingiere. Tiene fiebre. Observa sangre en la orina. Solicite ayuda de inmediato si: El dolor no desaparece en el tiempo indicado por el mdico. No puede dejar de vomitar. Siente dolor solamente en zonas especficas del abdomen, como el lado derecho o la parte inferior izquierda. Tiene heces con sangre, de color negro o con aspecto alquitranado. Tiene dolor muy intenso en el vientre, clicos o meteorismo. Presenta signos de no tener suficientes lquidos o agua en el cuerpo (deshidratacin), por ejemplo: Orina oscura, muy escasa o falta de orina. Labios agrietados. Sequedad de boca. Ojos  hundidos. Somnolencia. Debilidad. Tiene dificultad para respirar o dolor en el pecho. Resumen Muchos de estos casos de dolor de estmago pueden controlarse y tratarse en casa. Est atento al dolor de estmago para detectar cualquier cambio. Tome los medicamentos de venta libre y los recetados solamente como se lo haya indicado el mdico. Comunquese con un mdico si el dolor de estmago cambia o empeora. Busque ayuda de inmediato si tiene dolor muy intenso en el vientre, clicos o meteorismo. Esta informacin no tiene como fin reemplazar el consejo del mdico. Asegrese de hacerle al mdico cualquier pregunta que tenga. Document Revised: 03/22/2019 Document Reviewed: 03/22/2019 Elsevier Patient Education  2023 Elsevier Inc.  

## 2022-08-25 NOTE — Assessment & Plan Note (Signed)
Recurrent lower abdominal cramps with changes in bowel movements. Differential diagnosis discussed with patient. Clinically stable with benign abdominal exam. Needs colonoscopy. Referral placed today.

## 2022-08-25 NOTE — Assessment & Plan Note (Signed)
Active and affecting quality of life. Aware of triggers. Does not want medication at present time. Wants to talk to a therapist. Referral placed today.

## 2022-08-26 ENCOUNTER — Ambulatory Visit: Payer: Managed Care, Other (non HMO) | Admitting: Gastroenterology

## 2022-08-26 ENCOUNTER — Other Ambulatory Visit (INDEPENDENT_AMBULATORY_CARE_PROVIDER_SITE_OTHER): Payer: Managed Care, Other (non HMO)

## 2022-08-26 ENCOUNTER — Encounter: Payer: Self-pay | Admitting: Gastroenterology

## 2022-08-26 VITALS — BP 124/82 | HR 63 | Ht 61.0 in | Wt 133.0 lb

## 2022-08-26 DIAGNOSIS — R1013 Epigastric pain: Secondary | ICD-10-CM

## 2022-08-26 DIAGNOSIS — K219 Gastro-esophageal reflux disease without esophagitis: Secondary | ICD-10-CM | POA: Diagnosis not present

## 2022-08-26 DIAGNOSIS — D649 Anemia, unspecified: Secondary | ICD-10-CM

## 2022-08-26 DIAGNOSIS — Z1211 Encounter for screening for malignant neoplasm of colon: Secondary | ICD-10-CM

## 2022-08-26 DIAGNOSIS — Z1212 Encounter for screening for malignant neoplasm of rectum: Secondary | ICD-10-CM

## 2022-08-26 LAB — IBC + FERRITIN
Ferritin: 27.6 ng/mL (ref 10.0–291.0)
Iron: 90 ug/dL (ref 42–145)
Saturation Ratios: 21.3 % (ref 20.0–50.0)
TIBC: 422.8 ug/dL (ref 250.0–450.0)
Transferrin: 302 mg/dL (ref 212.0–360.0)

## 2022-08-26 LAB — FOLATE: Folate: 22.9 ng/mL (ref >5.9)

## 2022-08-26 LAB — VITAMIN B12: Vitamin B-12: 533 pg/mL (ref 211–911)

## 2022-08-26 MED ORDER — NA SULFATE-K SULFATE-MG SULF 17.5-3.13-1.6 GM/177ML PO SOLN: ORAL | 0 refills | Status: DC

## 2022-08-26 NOTE — Progress Notes (Signed)
Assessment    GERD, epigastric pain. R/O esophagitis, gastritis, ulcer Constipation Average risk CRC screening Mild normocytic anemia. History of B12 deficiency. R/O IDA  Recommendations   Follow antireflux measures and take pantoprazole 40 mg daily for 1 month (not prn) and then daily as needed Begin MiraLAX 1 capful daily Fe, TIBC, ferritin, B12, folate today Schedule colonoscopy and EGD. The risks (including bleeding, perforation, infection, missed lesions, medication reactions and possible hospitalization or surgery if complications occur), benefits, and alternatives to colonoscopy with possible biopsy and possible polypectomy were discussed with the patient and they consent to proceed.  The risks (including bleeding, perforation, infection, missed lesions, medication reactions and possible hospitalization or surgery if complications occur), benefits, and alternatives to endoscopy with possible biopsy and possible dilation were discussed with the patient and they consent to proceed.      HPI   Chief complaint: epigastric pain, constipation, CRC screening  Patient profile:  Heather Navarro is a 51 y.o. female referred by Agustina Caroli, MD for epigastric abdominal pain, constipation and CRC screening.  She relates several symptoms over the past few years.  She relates of infrequent episodes of right and left lower flank pain and lower back pain that do not appear to have correlation with any digestive function.  She has not tried Tylenol, Advil or other over the counter pain relievers for her flank and back pain. She relates problems with constipation over the past few years.  She has adjusted her diet and water intake with partial improvement in constipation.  She relates epigastric pain and heartburn symptoms occurring for the past few months.  When her symptoms are more severe she takes pantoprazole which has reduced her symptoms.  No prior colonoscopy.  She has a mild anemia with  known B12 deficiency and takes oral well.  She relates a history of iron deficiency years ago.  She has a history of kidney stones.  Denies weight loss, diarrhea, change in stool caliber, melena, hematochezia, nausea, vomiting, dysphagia, chest pain.    Previous Labs / Imaging::    Latest Ref Rng & Units 05/21/2022    4:23 PM 03/05/2022    9:26 AM 01/06/2022    3:35 PM  CBC  WBC 4.0 - 10.5 K/uL 5.2  3.7  5.3   Hemoglobin 12.0 - 15.0 g/dL 11.5  12.0  11.9   Hematocrit 36.0 - 46.0 % 35.6  36.8  36.5   Platelets 150.0 - 400.0 K/uL 268.0  262.0  273.0     Lab Results  Component Value Date   LIPASE 16.0 05/21/2022      Latest Ref Rng & Units 05/21/2022    4:23 PM 03/05/2022    9:26 AM 01/06/2022    3:35 PM  CMP  Glucose 70 - 99 mg/dL 87  97  87   BUN 6 - 23 mg/dL '12  13  11   '$ Creatinine 0.40 - 1.20 mg/dL 0.67  0.65  0.61   Sodium 135 - 145 mEq/L 138  142  139   Potassium 3.5 - 5.1 mEq/L 3.9  4.5  4.1   Chloride 96 - 112 mEq/L 103  107  104   CO2 19 - 32 mEq/L '26  29  28   '$ Calcium 8.4 - 10.5 mg/dL 10.3  10.9  10.7   Total Protein 6.0 - 8.3 g/dL 7.1  7.4  7.3   Total Bilirubin 0.2 - 1.2 mg/dL 0.4  0.5  0.4   Alkaline Phos 39 -  117 U/L 97  98  98   AST 0 - 37 U/L '15  15  23   '$ ALT 0 - 35 U/L '15  16  25      '$ Previous GI evaluation    Endoscopies:  None  Imaging:     Past Medical History:  Diagnosis Date   Anemia    Anxiety    Arthritis    Asthma    Depression    Depression    H. 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 No pertinent surgical history. Family History  Problem Relation Age of Onset   Hypertension Mother    Cancer Mother        lung   Diabetes Father    Heart disease Father    Lupus Father    Heart disease Brother    Cancer Brother        sarcoma   Breast cancer Neg Hx    Esophageal cancer Neg Hx    Colon cancer Neg Hx     Social History   Tobacco Use   Smoking status: Never   Smokeless tobacco: Never  Vaping Use   Vaping Use: Never used  Substance Use Topics   Alcohol use: Yes    Comment: socially   Drug use: No   Current Outpatient Medications  Medication Sig Dispense Refill   acyclovir cream (ZOVIRAX) 5 % Apply 1 Application topically every 3 (three) hours. (Patient not taking: Reported on 08/26/2022) 15 g 0   pantoprazole (PROTONIX) 40 MG tablet TAKE 1 TABLET BY MOUTH EVERY DAY (Patient not taking: Reported on 08/26/2022) 90 tablet 1   valACYclovir (VALTREX) 500 MG tablet Take one tablet daily (Patient not taking: Reported on 08/26/2022) 30 tablet 12   No current facility-administered medications for this visit.   Allergies  Allergen Reactions   Penicillins Swelling   Sulfa Antibiotics Itching   Latex Itching and Rash    Rash and itching  At times.    Macrobid [Nitrofurantoin Monohyd Macro] Rash   Other Rash    Review of Systems: All other systems reviewed and negative except where noted in HPI.    Physical Exam    Wt Readings from Last 3 Encounters:  08/26/22 133 lb (60.3 kg)  08/25/22 135 lb (61.2 kg)  05/21/22 130 lb 8 oz (59.2 kg)    BP 124/82   Pulse 63   Ht '5\' 1"'$  (1.549 m)   Wt 133 lb (60.3 kg)   BMI 25.13 kg/m  Constitutional:  Generally well appearing female in no acute distress. HEENT: Pupils normal.  Conjunctivae are normal. No scleral icterus. No oral lesion or deformities.  Neck: supple.  Cardiac: Normal rate, regular rhythm. No murmurs. Pulmonary/chest: Effort normal and breath sounds normal. No wheezing, rales or rhonchi. Abdominal: Soft, nondistended, epigastric tenderness. Active bowel sounds. No palpable HSM, masses or hernias. Rectal: Deferred to colonoscopy Neurological: Alert and oriented to person, place and time. Psychiatric: Pleasant. Normal mood and affect. Behavior is normal. Skin: Skin is warm and dry. No rashes noted.  Lucio Edward, MD    cc:  Referring Provider [place], [place], [place]

## 2022-08-26 NOTE — Patient Instructions (Addendum)
Start miralax over the counter daily Please take you Protonix daily for a month  If you are age 53 or older, your body mass index should be between 23-30. Your Body mass index is 25.13 kg/m. If this is out of the aforementioned range listed, please consider follow up with your Primary Care Provider.  If you are age 62 or younger, your body mass index should be between 19-25. Your Body mass index is 25.13 kg/m. If this is out of the aformentioned range listed, please consider follow up with your Primary Care Provider.   ________________________________________________________  The [facility] providers would like to encourage you to use [facility] to communicate with providers for non-urgent requests or questions.  Due to long hold times on the telephone, sending your provider a message by [facility] may be a faster and more efficient way to get a response.  Please allow 48 business hours for a response.  Please remember that this is for non-urgent requests.   You have been scheduled for an endoscopy and colonoscopy. Please follow the written instructions given to you at your visit today. Please pick up your prep supplies at the pharmacy within the next 1-3 days. If you use inhalers (even only as needed), please bring them with you on the day of your procedure.   Your provider has requested that you go to the basement level for lab work before leaving today. Press "B" on the elevator. The lab is located at the first door on the left as you exit the elevator.   Due to recent changes in healthcare laws, you may see the results of your imaging and laboratory studies on MyChart before your provider has had a chance to review them.  We understand that in some cases there may be results that are confusing or concerning to you. Not all laboratory results come back in the same time frame and the provider may be waiting for multiple results in order to interpret others.  Please give [place] 48 hours in order for your  provider to thoroughly review all the results before contacting the office for clarification of your results.    Thank you for entrusting me with your care and choosing [facility].  Dr Fuller Plan

## 2022-08-27 ENCOUNTER — Other Ambulatory Visit: Payer: Self-pay

## 2022-08-28 ENCOUNTER — Other Ambulatory Visit: Payer: Self-pay

## 2022-08-28 MED ORDER — IRON (FERROUS SULFATE) 325 (65 FE) MG PO TABS: 325.0000 mg | ORAL_TABLET | Freq: Every day | ORAL | Status: DC

## 2022-09-02 ENCOUNTER — Telehealth: Payer: Self-pay | Admitting: Gastroenterology

## 2022-09-02 ENCOUNTER — Encounter: Payer: Self-pay | Admitting: Gastroenterology

## 2022-09-02 NOTE — Telephone Encounter (Signed)
Patient called states [facility] do[facility]not [facility] order please resend.

## 2022-09-03 MED ORDER — NA SULFATE-K SULFATE-MG SULF 17.5-3.13-1.6 GM/177ML PO SOLN: ORAL | 0 refills | Status: DC

## 2022-09-03 NOTE — Telephone Encounter (Signed)
[facility].

## 2022-09-10 ENCOUNTER — Ambulatory Visit: Payer: Managed Care, Other (non HMO) | Admitting: Gastroenterology

## 2022-09-10 ENCOUNTER — Encounter: Payer: Self-pay | Admitting: Gastroenterology

## 2022-09-10 VITALS — BP 110/66 | HR 82 | Temp 96.2°F | Resp 17 | Ht 61.0 in | Wt 133.0 lb

## 2022-09-10 DIAGNOSIS — D509 Iron deficiency anemia, unspecified: Secondary | ICD-10-CM | POA: Diagnosis not present

## 2022-09-10 DIAGNOSIS — K219 Gastro-esophageal reflux disease without esophagitis: Secondary | ICD-10-CM

## 2022-09-10 DIAGNOSIS — Z1211 Encounter for screening for malignant neoplasm of colon: Secondary | ICD-10-CM

## 2022-09-10 DIAGNOSIS — R1013 Epigastric pain: Secondary | ICD-10-CM

## 2022-09-10 MED ORDER — SODIUM CHLORIDE 0.9 % IV SOLN: 500.0000 mL | Freq: Once | INTRAVENOUS | Status: DC

## 2022-09-10 NOTE — Progress Notes (Signed)
Pt's states no medical or surgical changes since previsit or office visit. 

## 2022-09-10 NOTE — Patient Instructions (Signed)
Handout provided about antireflux measures.  Repeat colonoscopy in 10 years for screening purposes.  Resume previous diet.  Continue present medications.  Follow antireflux measures.  Await pathology results from duodenum biopsy.    YOU HAD AN ENDOSCOPIC PROCEDURE TODAY AT [facility]:   Refer to the procedure report that was given to you for any specific questions about what was found during the examination.  If the procedure report does not answer your questions, please call your gastroenterologist to clarify.  If you requested that your care partner not be given the details of your procedure findings, then the procedure report has been included in a sealed envelope for you to review at your convenience later.  YOU SHOULD EXPECT: Some feelings of bloating in the abdomen. Passage of more gas than usual.  Walking can help get rid of the air that was put into your GI tract during the procedure and reduce the bloating. If you had a lower endoscopy (such as a colonoscopy or flexible sigmoidoscopy) you may notice spotting of blood in your stool or on the toilet paper. If you underwent a bowel prep for your procedure, you may not have a normal bowel movement for a few days.  Please Note:  You might notice some irritation and congestion in your nose or some drainage.  This is from the oxygen used during your procedure.  There is no need for concern and it should clear up in a day or so.  SYMPTOMS TO REPORT IMMEDIATELY:  Following lower endoscopy (colonoscopy or flexible sigmoidoscopy):  Excessive amounts of blood in the stool  Significant tenderness or worsening of abdominal pains  Swelling of the abdomen that is new, acute  Fever of 100F or higher  Following upper endoscopy (EGD)  Vomiting of blood or coffee ground material  New chest pain or pain under the shoulder blades  Painful or persistently difficult swallowing  New shortness of breath  Fever of 100F or higher  Black,  tarry-looking stools  For urgent or emergent issues, a gastroenterologist can be reached at any hour by calling [phone]. Do not use MyChart messaging for urgent concerns.    DIET:  We do recommend a small meal at first, but then you may proceed to your regular diet.  Drink plenty of fluids but you should avoid alcoholic beverages for 24 hours.  ACTIVITY:  You should plan to take it easy for the rest of today and you should NOT DRIVE or use heavy machinery until tomorrow (because of the sedation medicines used during the test).    FOLLOW UP: Our staff will call the number listed on your records the next business day following your procedure.  We will call around 7:15- 8:00 am to check on you and address any questions or concerns that you may have regarding the information given to you following your procedure. If we do not reach you, we will leave a message.     If any biopsies were taken you will be contacted by phone or by letter within the next 1-3 weeks.  Please call us at [phone] if you have not heard about the biopsies in 3 weeks.    SIGNATURES/CONFIDENTIALITY: You and/or your care partner have signed paperwork which will be entered into your electronic medical record.  These signatures attest to the fact that that the information above on your After Visit Summary has been reviewed and is understood.  Full responsibility of the confidentiality of this discharge information  lies with you and/or your care-partner.

## 2022-09-10 NOTE — Progress Notes (Signed)
See  08/26/2022 H&P, no changes

## 2022-09-10 NOTE — Progress Notes (Deleted)
History & Physical  Primary Care Physician:  Horald Pollen, MD Primary Gastroenterologist: Lucio Edward, MD  CHIEF COMPLAINT:  CRC screening, IDA, epigastric pain, GERD   HPI: Heather Navarro is a 51 y.o. female CRC screening, iron deficiency anemia, epigastric pain and GERD for colonoscopy and EGD   Past Medical History:  Diagnosis Date   Anemia    Anxiety    Arthritis    Asthma    Depression    Depression    H. 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 No pertinent surgical history.  Prior to Admission medications   Medication Sig Start Date End Date Taking? Authorizing Provider  acyclovir cream (ZOVIRAX) 5 % Apply 1 Application topically every 3 (three) hours. Patient not taking: Reported on 08/26/2022 05/21/22   Horald Pollen, MD  Iron, Ferrous Sulfate, 325 (65 Fe) MG TABS Take 325 mg by mouth daily. Patient not taking: Reported on 09/10/2022 08/28/22   Ladene Artist, MD  pantoprazole (PROTONIX) 40 MG tablet TAKE 1 TABLET BY MOUTH EVERY DAY Patient not taking: Reported on 08/26/2022 08/17/22   Horald Pollen, MD  valACYclovir (VALTREX) 500 MG tablet Take one tablet daily Patient not taking: Reported on 08/26/2022 05/21/22   Horald Pollen, MD    Current Outpatient Medications  Medication Sig Dispense Refill   acyclovir cream (ZOVIRAX) 5 % Apply 1 Application topically every 3 (three) hours. (Patient not taking: Reported on 08/26/2022) 15 g 0   Iron, Ferrous Sulfate, 325 (65 Fe) MG TABS Take 325 mg by mouth daily. (Patient not taking: Reported on 09/10/2022)     pantoprazole (PROTONIX) 40 MG tablet TAKE 1 TABLET BY MOUTH EVERY DAY (Patient not taking: Reported on 08/26/2022) 90 tablet 1   valACYclovir (VALTREX) 500 MG tablet Take one tablet daily (Patient not taking: Reported on 08/26/2022) 30 tablet 12   Current  Facility-Administered Medications  Medication Dose Route Frequency Provider Last Rate Last Admin   0.9 %  sodium chloride infusion  500 mL Intravenous Once Ladene Artist, MD        Allergies as of 09/10/2022 - Review Complete 09/10/2022  Allergen Reaction Noted   Penicillins Swelling 03/18/2012   Sulfa antibiotics Itching 03/18/2012   Latex Itching and Rash 11/05/2014   Macrobid [nitrofurantoin monohyd macro] Rash 05/06/2016   Other Rash 05/06/2016    Family History  Problem Relation Age of Onset   Hypertension Mother    Cancer Mother        lung   Diabetes Father    Heart disease Father    Lupus Father    Heart disease Brother    Cancer Brother        sarcoma   Breast cancer Neg Hx    Esophageal cancer Neg Hx    Colon cancer Neg Hx     Social History   Socioeconomic History   Marital status: Married    Spouse name: Not on file   Number of children: 1   Years of education: Not on file   Highest education level: Not on file  Occupational History   Occupation: Geologist, engineering  Tobacco Use   Smoking status: Never   Smokeless tobacco: Never  Vaping Use   Vaping Use: Never used  Substance and Sexual Activity   Alcohol use: Yes    Comment: socially   Drug use:  No   Sexual activity: Yes    Birth control/protection: None    Comment: 1st intercourse- 23, partners- greater than 5  Other Topics Concern   Not on file  Social History Narrative   Not on file   Social Determinants of Health   Financial Resource Strain: Not on file  Food Insecurity: Not on file  Transportation Needs: Not on file  Physical Activity: Not on file  Stress: Not on file  Social Connections: Not on file  Intimate Partner Violence: Not on file    Review of Systems:  All systems reviewed were negative except where noted in HPI.   Physical Exam: General:  Alert, well-developed, in NAD Head:  Normocephalic and atraumatic. Eyes:  Sclera clear, no icterus.   Conjunctiva  pink. Ears:  Normal auditory acuity. Mouth:  No deformity or lesions.  Neck:  Supple; no masses . Lungs:  Clear throughout to auscultation.   No wheezes, crackles, or rhonchi. No acute distress. Heart:  Regular rate and rhythm; no murmurs. Abdomen:  Soft, nondistended, nontender. No masses, hepatomegaly. No obvious masses.  Normal bowel .    Rectal:  Deferred   Msk:  Symmetrical without gross deformities.. Pulses:  Normal pulses noted. Extremities:  Without edema. Neurologic:  Alert and  oriented x4;  grossly normal neurologically. Skin:  Intact without significant lesions or rashes. Psych:  Alert and cooperative. Normal mood and affect.  Impression / Plan:   CRC screening, iron deficiency anemia, epigastric pain and GERD for colonoscopy and EGD  Heather Navarro T. Fuller Plan  09/10/2022, 2:47 PM See Shea Evans, [facility], to contact our on call provider

## 2022-09-10 NOTE — Progress Notes (Signed)
Called to room to assist during endoscopic procedure.  Patient ID and intended procedure confirmed with present staff. Received instructions for my participation in the procedure from the performing physician.  

## 2022-09-10 NOTE — Op Note (Signed)
[facility] Patient Name: Heather Navarro Procedure Date: 09/10/2022 2:37 PM MRN: [id number] Endoscopist: Ladene Artist , MD, [id number] Age: 51 Referring MD:  Date of Birth: Mar 03, 1971 Gender: Female Account #: [id number] Procedure:                Colonoscopy Indications:              Screening for colorectal malignant neoplasm, Iron                            deficiency anemia Medicines:                Monitored Anesthesia Care Procedure:                Pre-Anesthesia Assessment:                           - Prior to the procedure, a History and Physical                            was performed, and patient medications and                            allergies were reviewed. The patient's tolerance of                            previous anesthesia was also reviewed. The risks                            and benefits of the procedure and the sedation                            options and risks were discussed with the patient.                            All questions were answered, and informed consent                            was obtained. Prior Anticoagulants: The patient has                            taken no anticoagulant or antiplatelet agents. ASA                            Grade Assessment: II - A patient with mild systemic                            disease. After reviewing the risks and benefits,                            the patient was deemed in satisfactory condition to                            undergo the procedure.  After obtaining informed consent, the colonoscope                            was passed under direct vision. Throughout the                            procedure, the patient's blood pressure, pulse, and                            oxygen saturations were monitored continuously. The                            PCF-HQ190L Colonoscope was introduced through the                            anus and advanced to the the  cecum, identified by                            appendiceal orifice and ileocecal valve. The                            ileocecal valve, appendiceal orifice, and rectum                            were photographed. The quality of the bowel                            preparation was good. The colonoscopy was performed                            without difficulty. The patient tolerated the                            procedure well. Scope In: 2:53:31 PM Scope Out: 3:08:27 PM Scope Withdrawal Time: 0 hours 11 minutes 21 seconds  Total Procedure Duration: 0 hours 14 minutes 56 seconds  Findings:                 The perianal and digital rectal examinations were                            normal.                           The entire examined colon appeared normal on direct                            and retroflexion views. Complications:            No immediate complications. Estimated blood loss:                            None. Estimated Blood Loss:     Estimated blood loss: none. Impression:               - The entire examined colon is normal on direct and  retroflexion views.                           - No specimens collected. Recommendation:           - Repeat colonoscopy in 10 years for screening                            purposes.                           - Patient has a contact number available for                            emergencies. The signs and symptoms of potential                            delayed complications were discussed with the                            patient. Return to normal activities tomorrow.                            Written discharge instructions were provided to the                            patient.                           - Resume previous diet.                           - Continue present medications. Ladene Artist, MD 09/10/2022 3:11:06 PM This report has been signed electronically.

## 2022-09-10 NOTE — Op Note (Signed)
[place] Patient Name: Heather Navarro Procedure Date: 09/10/2022 2:37 PM MRN: [id number] Endoscopist: Ladene Artist , MD, [id number] Age: 51 Referring MD:  Date of Birth: Jul 24, 1971 Gender: Female Account #: [id number] Procedure:                Upper GI endoscopy Indications:              Epigastric abdominal pain, Iron deficiency anemia,                            Gastroesophageal reflux disease Medicines:                Monitored Anesthesia Care Procedure:                Pre-Anesthesia Assessment:                           - Prior to the procedure, a History and Physical                            was performed, and patient medications and                            allergies were reviewed. The patient's tolerance of                            previous anesthesia was also reviewed. The risks                            and benefits of the procedure and the sedation                            options and risks were discussed with the patient.                            All questions were answered, and informed consent                            was obtained. Prior Anticoagulants: The patient has                            taken no anticoagulant or antiplatelet agents. ASA                            Grade Assessment: II - A patient with mild systemic                            disease. After reviewing the risks and benefits,                            the patient was deemed in satisfactory condition to                            undergo the procedure.  After obtaining informed consent, the endoscope was                            passed under direct vision. Throughout the                            procedure, the patient's blood pressure, pulse, and                            oxygen saturations were monitored continuously. The                            Endoscope was introduced through the mouth, and                            advanced to the  second part of duodenum. The upper                            GI endoscopy was accomplished without difficulty.                            The patient tolerated the procedure well. Scope In: Scope Out: Findings:                 The esophagus was normal.                           The stomach was normal.                           The examined duodenum was normal. Biopsies for                            histology were taken with a cold forceps for                            evaluation of celiac disease.                           The cardia and gastric fundus were normal on                            retroflexion. Complications:            No immediate complications. Estimated Blood Loss:     Estimated blood loss: none. Impression:               - Normal esophagus.                           - Normal stomach.                           - Normal examined duodenum. Biopsied. Recommendation:           - Patient has a contact number available for  emergencies. The signs and symptoms of potential                            delayed complications were discussed with the                            patient. Return to normal activities tomorrow.                            Written discharge instructions were provided to the                            patient.                           - Resume previous diet.                           - Follow antireflux measures.                           - Continue present medications.                           - Await pathology results. Ladene Artist, MD 09/10/2022 3:26:56 PM This report has been signed electronically.

## 2022-09-10 NOTE — Progress Notes (Signed)
Pt resting comfortably. VSS. Airway intact. SBAR complete to RN. All questions answered.   

## 2022-09-11 ENCOUNTER — Telehealth: Payer: Self-pay | Admitting: *Deleted

## 2022-09-11 NOTE — Telephone Encounter (Signed)
Left message on f/u call 

## 2022-09-14 ENCOUNTER — Telehealth: Payer: Self-pay | Admitting: Gastroenterology

## 2022-09-14 NOTE — Telephone Encounter (Signed)
Patient called in states she has been experiencing a sore throat & irritation since 09/11/22 the day post her endo colon. She says her throat is very red & the roof of her mouth has small bumps that are very itchy, worse after meals. Denies any SOB. She's able to tolerate liquids/food, but states the irritation is very uncomfortable. Will route to MD.

## 2022-09-14 NOTE — Telephone Encounter (Signed)
Inbound call from patient stating that she had a endoscopy and colonoscopy on 12/14. Patient stated that she feels like she has something in her throat and is having a hard time eating. Patient is requesting a call back to discuss. Patient stated that she will be going back to work in 20 minutes and is requesting a call before she goes back if possible. Please advise.

## 2022-09-14 NOTE — Telephone Encounter (Signed)
Very unlikely her persist sore/irritated throat, mouth symptoms are related to her EGD which was 3 days ago.  Recommend evaluation by her PCP or an Urgent Care center.

## 2022-09-14 NOTE — Telephone Encounter (Signed)
Left detailed message with MD recommendations for patient per her request. Advised at end of message that she call back with any further questions.

## 2022-09-14 NOTE — Telephone Encounter (Signed)
Left message for patient to call back  

## 2022-09-16 ENCOUNTER — Ambulatory Visit: Payer: Managed Care, Other (non HMO) | Admitting: Emergency Medicine

## 2022-09-16 ENCOUNTER — Other Ambulatory Visit: Payer: Managed Care, Other (non HMO)

## 2022-09-16 ENCOUNTER — Encounter: Payer: Self-pay | Admitting: Emergency Medicine

## 2022-09-16 VITALS — BP 110/74 | HR 64 | Temp 98.5°F | Ht 61.0 in | Wt 135.2 lb

## 2022-09-16 DIAGNOSIS — R1319 Other dysphagia: Secondary | ICD-10-CM | POA: Insufficient documentation

## 2022-09-16 MED ORDER — PREDNISONE 20 MG PO TABS: 20.0000 mg | ORAL_TABLET | Freq: Every day | ORAL | 0 refills | Status: AC

## 2022-09-16 NOTE — Progress Notes (Signed)
Heather Navarro 51 y.o.   Chief Complaint  Patient presents with   Follow-up    Patient has irritation in her throat after she got her endoscopy. Trouble swallowing     HISTORY OF PRESENT ILLNESS: This is a 51 y.o. female recently had upper endoscopy and since complaining of itching and irritation in her throat and some trouble swallowing No other associated symptoms No other complaints or medical concerns Report of upper endoscopy and colonoscopy reviewed with patient.  Normal findings.  HPI   Prior to Admission medications   Not on File    Allergies  Allergen Reactions   Penicillins Swelling   Sulfa Antibiotics Itching   Latex Itching and Rash    Rash and itching  At times.    Macrobid [Nitrofurantoin Monohyd Macro] Rash   Other Rash    Patient Active Problem List   Diagnosis Date Noted   Lower abdominal pain 08/25/2022   Breast pain, left 08/25/2022   Situational mixed anxiety and depressive disorder 08/25/2022   Recurrent genital herpes 05/21/2022   Dyspepsia 05/21/2022   Stress incontinence 02/17/2022   Chronic bilateral low back pain without sciatica 02/17/2022   Chronic neck pain 02/17/2022   Tinnitus of left ear 11/06/2020   History of coma 10/02/2020   History of ELISA positive for HSV 10/30/2014   Adjustment disorder with depressed mood 10/30/2014   H/O vitamin D deficiency 04/07/2012   History of anemia 04/07/2012   Hypercholesterolemia 04/07/2012    Past Medical History:  Diagnosis Date   Anemia    Anxiety    Arthritis    Asthma    Depression    Depression    H. 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  Social History   Socioeconomic History   Marital status: Married    Spouse name: Not on file   Number of children: 1   Years of education: Not on file   Highest education level: Not on file   Occupational History   Occupation: Geologist, engineering  Tobacco Use   Smoking status: Never   Smokeless tobacco: Never  Vaping Use   Vaping Use: Never used  Substance and Sexual Activity   Alcohol use: Yes    Comment: socially   Drug use: No   Sexual activity: Yes    Birth control/protection: None    Comment: 1st intercourse- 23, partners- greater than 5  Other Topics Concern   Not on file  Social History Narrative   Not on file   Social Determinants of Health   Financial Resource Strain: Not on file  Food Insecurity: Not on file  Transportation Needs: Not on file  Physical Activity: Not on file  Stress: Not on file  Social Connections: Not on file  Intimate Partner Violence: Not on file    Family History  Problem Relation Age of Onset   Hypertension Mother    Cancer Mother        lung   Diabetes Father    Heart disease Father    Lupus Father    Heart disease Brother    Cancer Brother        sarcoma   Breast cancer Neg Hx    Esophageal cancer Neg Hx    Colon cancer Neg Hx      Review of Systems  Constitutional: Negative.  Negative for  chills and fever.  HENT:  Negative for congestion and sore throat.   Respiratory: Negative.  Negative for cough and shortness of breath.   Cardiovascular: Negative.  Negative for chest pain and palpitations.  Gastrointestinal:  Negative for abdominal pain, diarrhea, nausea and vomiting.  Genitourinary: Negative.  Negative for dysuria and hematuria.  Skin: Negative.  Negative for rash.  Neurological: Negative.  Negative for dizziness and headaches.  All other systems reviewed and are negative.  Today's Vitals   09/16/22 1519  BP: 110/74  Pulse: 64  Temp: 98.5 F (36.9 C)  TempSrc: Oral  SpO2: 98%  Weight: 135 lb 4 oz (61.3 kg)  Height: '5\' 1"'$  (1.549 m)   Body mass index is 25.56 kg/m.   Physical Exam Vitals reviewed.  Constitutional:      Appearance: Normal appearance.  HENT:     Head: Normocephalic.      Mouth/Throat:     Mouth: Mucous membranes are moist.     Pharynx: Oropharynx is clear.  Eyes:     Extraocular Movements: Extraocular movements intact.     Pupils: Pupils are equal, round, and reactive to light.  Cardiovascular:     Rate and Rhythm: Normal rate and regular rhythm.     Pulses: Normal pulses.     Heart sounds: Normal heart sounds.  Pulmonary:     Effort: Pulmonary effort is normal.     Breath sounds: Normal breath sounds.  Musculoskeletal:     Cervical back: No tenderness.  Lymphadenopathy:     Cervical: No cervical adenopathy.  Skin:    General: Skin is warm and dry.  Neurological:     General: No focal deficit present.     Mental Status: She is alert and oriented to person, place, and time.  Psychiatric:        Mood and Affect: Mood normal.        Behavior: Behavior normal.      ASSESSMENT & PLAN: A total of 32 minutes was spent with the patient and counseling/coordination of care regarding preparing for this visit, review of most recent office visit notes, review of most recent upper endoscopy and colonoscopy reports, review of all medications, education and nutrition, differential diagnosis of esophageal dysphagia, prognosis, documentation, need for follow-up.  Problem List Items Addressed This Visit       Digestive   Esophageal dysphagia - Primary    Most likely residual inflammation secondary to recent upper endoscopy May benefit from daily prednisone 20 mg for the next 5 days. Diet and nutrition discussed Advised to stay well-hydrated Contact the office if no better or worse during the next several days      Relevant Medications   predniSONE (DELTASONE) 20 MG tablet   Patient Instructions  Disfagia Dysphagia  La disfagia es un problema para tragar. Esta afeccin se produce cuando los slidos y lquidos se pegan a la garganta de Furniture conservator/restorer persona en su recorrido hasta el estmago o cuando el alimento tarda ms tiempo del habitual en llegar al  [employer]. Puede tener problemas para tragar alimentos, lquidos o ambos. Tambin puede tener dolor cuando intenta tragar. Puede llevarle ms tiempo y Medical illustrator. Cules son las causas? Esta afeccin puede ser causada por lo siguiente: Problemas musculares. Es posible que Pension scheme manager dificulten el paso de los alimentos y lquidos por el esfago, el tubo que conecta la boca con el [place]. Obstrucciones. Puede tener lceras, tejido cicatricial o inflamacin que bloquea el paso normal de los alimentos y  lquidos. Las causas de estos problemas incluyen las siguientes: Reflujo cido desde el estmago hacia el esfago (reflujo gastroesofgico). Infecciones. Radioterapia para tratar Science writer. Medicamentos que se toman sin la cantidad suficiente de lquido para IT sales professional. Accidente cerebrovascular. Puede afectar los nervios y hacer que sea difcil tragar. Problemas neurolgicos. Estos impiden que se enven seales a los msculos del esfago para que se aprieten (contraigan) y [place] lo que traga hacia el [place]. Globo farngeo. Este es un problema comn que consiste en sentir como si hubiera una obstruccin en la garganta o una sensacin de problema para tragar incluso si no hay nada mal en las vas de deglucin. Algunas afecciones, tales como parlisis cerebral o enfermedad de Parkinson. Cules son los signos o sntomas? Los sntomas frecuentes de esta afeccin incluyen los siguientes: Una sensacin de que los slidos o lquidos se traban en la garganta antes de llegar al estmago. Dolor al tragar. Tos o arcadas cuando intenta tragar. Otros sntomas pueden incluir los siguientes: Que los alimentos vuelvan del estmago a la boca (regurgitacin). Ruidos provenientes de Patent examiner. Molestias en el pecho al tragar. Sensacin de saciedad cuando traga. Babeo, especialmente cuando la garganta est obstruida. Acidez estomacal. Cmo se diagnostica? Este trastorno puede  diagnosticarse mediante: Radiografia con deglucin de bario. En esta prueba, tragar un lquido blanco que se pega en la parte interna del esfago. Luego se toman radiografas. Endoscopa. En esta prueba, se inserta un telescopio flexible por la garganta para ver el esfago y Product manager. Exploraciones por tomografa computarizada (TC) o resonancias magnticas (RM). Cmo se trata? El tratamiento de la disfagia depende de la causa de esta afeccin: Si la causa de la disfagia es el reflujo cido o una infeccin podrn indicarle medicamentos. Estos pueden incluir antibiticos o medicamentos para la Merchant navy officer. Si la causa de la disfagia son problemas en los msculos, ser necesario seguir una terapia para fortalecer estos msculos que intervienen en la deglucin. Puede tener que hacer ejercicios especficos para Software engineer o Warden/ranger. Si la causa es una obstruccin o un bulto, se realizarn procedimientos para remover la obstruccin. Es posible que necesite [place] y Ardelia Mems sonda de alimentacin. Puede necesitar hacer cambios en la dieta. Pida instrucciones especficas a su mdico. Siga estas instrucciones en su casa: Medicamentos Use los medicamentos de venta libre y los recetados solamente como se lo haya indicado el mdico. Si le recetaron un antibitico, tmelo como se lo haya indicado el mdico. No deje de tomar el antibitico aunque comience a sentirse mejor. Comida y bebida  [employer] cambios en la dieta que le haya indicado el mdico. Genev[employer] Bi con un especialista en alimentacin y nutricin (nutricionista) para crear un plan de alimentacin que le ayude a obtener los nutrientes que necesita a fin de mantenerse sano. Coma alimentos blandos que sean fciles de tragar. Corte los alimentos en trozos pequeos y coma lentamente. Ingiera bocados pequeos. Mantngase sentado y erguido para comer y beber. No consuma alcohol ni cafena. Si necesita ayuda para dejar de consumir estos  productos, consulte al [employer]. Indicaciones generales Controle su peso todos los [place] para asegurarse de que no est perdiendo [place]. No consuma ningn producto que contenga nicotina o tabaco. Estos productos incluyen cigarrillos, tabaco para Higher education careers adviser y aparatos de vapeo, como los Psychologist, sport and exercise. Si necesita ayuda para dejar de consumir estos productos, consulte al [employer]. Cumpla con todas las visitas de seguimiento. Esto es importante. Comunquese con un mdico si: Pierde peso porque no puede tragar.  Tose cuando bebe lquidos. Tose y elimina comida parcialmente digerida. Solicite ayuda de inmediato si: No puede tragar la saliva. Tiene dificultad para respirar, tiene fiebre o ambos. Tiene la voz ronca y tiene problemas para tragar. Estos sntomas pueden representar un problema grave que constituye Engineer, maintenance (IT). No espere a ver si los sntomas desaparecen. Solicite atencin mdica de inmediato. Comunquese con el servicio de emergencias de su localidad (911 en los [place]). No conduzca por sus propios medios [employer]. Resumen La disfagia es un problema para tragar. Esta afeccin se produce cuando los slidos y lquidos se quedan en la garganta de una persona en su recorrido Research scientist (medical). Es posible que tenga tos o arcadas cuando intenta tragar. La disfagia tiene muchas causas posibles. El tratamiento de la disfagia depende de la causa de la afeccin. Cumpla con todas las visitas de seguimiento. Esto es importante. Esta informacin no tiene Marine scientist el consejo del mdico. Asegrese de hacerle al mdico cualquier pregunta que tenga. Document Revised: 07/10/2020 Document Reviewed: 07/10/2020 Elsevier Patient Education  [place], MD [facility]

## 2022-09-16 NOTE — Patient Instructions (Signed)
Disfagia Dysphagia  La disfagia es un problema para tragar. Esta afeccin se produce cuando los slidos y lquidos se pegan a la garganta de Furniture conservator/restorer persona en su recorrido hasta el estmago o cuando el alimento tarda ms tiempo del habitual en llegar al [employer]. Puede tener problemas para tragar alimentos, lquidos o ambos. Tambin puede tener dolor cuando intenta tragar. Puede llevarle ms tiempo y Medical illustrator. Cules son las causas? Esta afeccin puede ser causada por lo siguiente: Problemas musculares. Es posible que Pension scheme manager dificulten el paso de los alimentos y lquidos por el esfago, el tubo que conecta la boca con el [place]. Obstrucciones. Puede tener lceras, tejido cicatricial o inflamacin que bloquea el paso normal de los alimentos y lquidos. Las causas de estos problemas incluyen las siguientes: Reflujo cido desde el estmago hacia el esfago (reflujo gastroesofgico). Infecciones. Radioterapia para tratar Science writer. Medicamentos que se toman sin la cantidad suficiente de lquido para IT sales professional. Accidente cerebrovascular. Puede afectar los nervios y hacer que sea difcil tragar. Problemas neurolgicos. Estos impiden que se enven seales a los msculos del esfago para que se aprieten (contraigan) y [place] lo que traga hacia el [place]. Globo farngeo. Este es un problema comn que consiste en sentir como si hubiera una obstruccin en la garganta o una sensacin de problema para tragar incluso si no hay nada mal en las vas de deglucin. Algunas afecciones, tales como parlisis cerebral o enfermedad de Parkinson. Cules son los signos o sntomas? Los sntomas frecuentes de esta afeccin incluyen los siguientes: Una sensacin de que los slidos o lquidos se traban en la garganta antes de llegar al estmago. Dolor al tragar. Tos o arcadas cuando intenta tragar. Otros sntomas pueden incluir los siguientes: Que los alimentos vuelvan del estmago a la  boca (regurgitacin). Ruidos provenientes de Patent examiner. Molestias en el pecho al tragar. Sensacin de saciedad cuando traga. Babeo, especialmente cuando la garganta est obstruida. Acidez estomacal. Cmo se diagnostica? Este trastorno puede diagnosticarse mediante: Radiografia con deglucin de bario. En esta prueba, tragar un lquido blanco que se pega en la parte interna del esfago. Luego se toman radiografas. Endoscopa. En esta prueba, se inserta un telescopio flexible por la garganta para ver el esfago y Product manager. Exploraciones por tomografa computarizada (TC) o resonancias magnticas (RM). Cmo se trata? El tratamiento de la disfagia depende de la causa de esta afeccin: Si la causa de la disfagia es el reflujo cido o una infeccin podrn indicarle medicamentos. Estos pueden incluir antibiticos o medicamentos para la Merchant navy officer. Si la causa de la disfagia son problemas en los msculos, ser necesario seguir una terapia para fortalecer estos msculos que intervienen en la deglucin. Puede tener que hacer ejercicios especficos para Software engineer o Warden/ranger. Si la causa es una obstruccin o un bulto, se realizarn procedimientos para remover la obstruccin. Es posible que necesite [place] y Ardelia Mems sonda de alimentacin. Puede necesitar hacer cambios en la dieta. Pida instrucciones especficas a su mdico. Siga estas instrucciones en su casa: Medicamentos Use los medicamentos de venta libre y los recetados solamente como se lo haya indicado el mdico. Si le recetaron un antibitico, tmelo como se lo haya indicado el mdico. No deje de tomar el antibitico aunque comience a sentirse mejor. Comida y bebida  [employer] cambios en la dieta que le haya indicado el mdico. Genevive Bi con un especialista en alimentacin y nutricin (nutricionista) para crear un plan de alimentacin que le ayude a obtener los nutrientes  que necesita a fin de mantenerse sano. Coma  alimentos blandos que sean fciles de tragar. Corte los alimentos en trozos pequeos y coma lentamente. Ingiera bocados pequeos. Mantngase sentado y erguido para comer y beber. No consuma alcohol ni cafena. Si necesita ayuda para dejar de consumir estos productos, consulte al [employer]. Indicaciones generales Controle su peso todos los [place] para asegurarse de que no est perdiendo [place]. No consuma ningn producto que contenga nicotina o tabaco. Estos productos incluyen cigarrillos, tabaco para Higher education careers adviser y aparatos de vapeo, como los Psychologist, sport and exercise. Si necesita ayuda para dejar de consumir estos productos, consulte al [employer]. Cumpla con todas las visitas de seguimiento. Esto es importante. Comunquese con un mdico si: Pierde peso porque no puede tragar. Tose cuando bebe lquidos. Tose y elimina comida parcialmente digerida. Solicite ayuda de inmediato si: No puede tragar la saliva. Tiene dificultad para respirar, tiene fiebre o ambos. Tiene la voz ronca y tiene problemas para tragar. Estos sntomas pueden representar un problema grave que constituye Engineer, maintenance (IT). No espere a ver si los sntomas desaparecen. Solicite atencin mdica de inmediato. Comunquese con el servicio de emergencias de su localidad (911 en los [place]). No conduzca por sus propios medios [employer]. Resumen La disfagia es un problema para tragar. Esta afeccin se produce cuando los slidos y lquidos se quedan en la garganta de una persona en su recorrido Research scientist (medical). Es posible que tenga tos o arcadas cuando intenta tragar. La disfagia tiene muchas causas posibles. El tratamiento de la disfagia depende de la causa de la afeccin. Cumpla con todas las visitas de seguimiento. Esto es importante. Esta informacin no tiene Marine scientist el consejo del mdico. Asegrese de hacerle al mdico cualquier pregunta que tenga. Document Revised: 07/10/2020 Document Reviewed: 07/10/2020 Elsevier  Patient Education  [place].

## 2022-09-16 NOTE — Assessment & Plan Note (Signed)
Most likely residual inflammation secondary to recent upper endoscopy May benefit from daily prednisone 20 mg for the next 5 days. Diet and nutrition discussed Advised to stay well-hydrated Contact the office if no better or worse during the next several days

## 2022-09-29 ENCOUNTER — Encounter: Payer: Self-pay | Admitting: Gastroenterology

## 2022-10-05 ENCOUNTER — Ambulatory Visit (HOSPITAL_COMMUNITY): Payer: Self-pay | Admitting: Psychiatry

## 2022-10-05 NOTE — Progress Notes (Deleted)
Psychiatric Initial Adult Assessment   Patient Identification: Heather Navarro MRN:  [id number] Date of Evaluation:  10/05/2022 Referral Source: *** Chief Complaint:  No chief complaint on file.  Visit Diagnosis: No diagnosis found.   Assessment:  Heather Navarro is a 52 y.o. female with a history of *** who presents in person to [place] at [facility] for initial evaluation on 10/05/2022.    Patient reports ***  A number of assessments were performed during the evaluation today including  PHQ-9 which they scored a *** on, GAD-7 which they scored a *** on, and [place] suicide severity screening which showed ***.  Based on these assessments patient would benefit from medication adjustment to better target their symptoms.  Plan: - - - - Crisis resources reviewed - Follow up in  History of Present Illness:  ***  Associated Signs/Symptoms: Depression Symptoms:  {DEPRESSION SYMPTOMS:20000} (Hypo) Manic Symptoms:  {BHH MANIC SYMPTOMS:22872} Anxiety Symptoms:  {BHH ANXIETY SYMPTOMS:22873} Psychotic Symptoms:  {BHH PSYCHOTIC SYMPTOMS:22874} PTSD Symptoms: {BHH PTSD SYMPTOMS:22875}  Past Psychiatric History: ***  Previous Psychotropic Medications: {YES/NO:21197}  Substance Abuse History in the last 12 months:  {yes no:314532}  Consequences of Substance Abuse: {BHH CONSEQUENCES OF SUBSTANCE ABUSE:22880}  Past Medical History:  Past Medical History:  Diagnosis Date   Anemia    Anxiety    Arthritis    Asthma    Depression    Depression    H. 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  Family Psychiatric History: ***  Family History:  Family History  Problem Relation Age of Onset   Hypertension Mother    Cancer Mother        lung   Diabetes Father    Heart disease Father    Lupus Father    Heart disease  Brother    Cancer Brother        sarcoma   Breast cancer Neg Hx    Esophageal cancer Neg Hx    Colon cancer Neg Hx     Social History:   Social History   Socioeconomic History   Marital status: Married    Spouse name: Not on file   Number of children: 1   Years of education: Not on file   Highest education level: Not on file  Occupational History   Occupation: Geologist, engineering  Tobacco Use   Smoking status: Never   Smokeless tobacco: Never  Vaping Use   Vaping Use: Never used  Substance and Sexual Activity   Alcohol use: Yes    Comment: socially   Drug use: No   Sexual activity: Yes    Birth control/protection: None    Comment: 1st intercourse- 23, partners- greater than 5  Other Topics Concern   Not on file  Social History Narrative   Not on file   Social Determinants of Health   Financial Resource Strain: Not on file  Food Insecurity: Not on file  Transportation Needs: Not on file  Physical Activity: Not on file  Stress: Not on file  Social Connections: Not on file    Additional Social History: ***  Allergies:   Allergies  Allergen Reactions   Penicillins Swelling   Sulfa Antibiotics Itching   Latex Itching and Rash    Rash and itching  At times.    Macrobid [Nitrofurantoin Monohyd Macro] Rash   Other Rash  Metabolic Disorder Labs: Lab Results  Component Value Date   HGBA1C 6.1 03/05/2022   No results found for: "PROLACTIN" Lab Results  Component Value Date   CHOL 266 (H) 01/06/2022   TRIG 153.0 (H) 01/06/2022   HDL 62.80 01/06/2022   CHOLHDL 4 01/06/2022   VLDL 30.6 01/06/2022   LDLCALC 173 (H) 01/06/2022   Lab Results  Component Value Date   TSH 3.30 01/06/2022    Therapeutic Level Labs: No results found for: "LITHIUM" No results found for: "CBMZ" No results found for: "VALPROATE"  Current Medications: No current outpatient medications on file.   No current facility-administered medications for this visit.     Musculoskeletal: Strength & Muscle Tone: {desc; muscle tone:32375} Gait & Station: {PE GAIT ED MYTR:17356} Patient leans: {Patient Leans:21022755}  Psychiatric Specialty Exam: Review of Systems  There were no vitals taken for this visit.There is no height or weight on file to calculate BMI.  General Appearance: {Appearance:22683}  Eye Contact:  {BHH EYE CONTACT:22684}  Speech:  {Speech:22685}  Volume:  {Volume (PAA):22686}  Mood:  {BHH MOOD:22306}  Affect:  {Affect (PAA):22687}  Thought Process:  {Thought Process (PAA):22688}  Orientation:  {BHH ORIENTATION (PAA):22689}  Thought Content:  {Thought Content:22690}  Suicidal Thoughts:  {ST/HT (PAA):22692}  Homicidal Thoughts:  {ST/HT (PAA):22692}  Memory:  {BHH MEMORY:22881}  Judgement:  {Judgement (PAA):22694}  Insight:  {Insight (PAA):22695}  Psychomotor Activity:  {Psychomotor (PAA):22696}  Concentration:  {Concentration:21399}  Recall:  {BHH GOOD/FAIR/POOR:22877}  Fund of Knowledge:{BHH GOOD/FAIR/POOR:22877}  Language: {BHH GOOD/FAIR/POOR:22877}  Akathisia:  {BHH YES OR NO:22294}    AIMS (if indicated):  {Desc; done/not:10129}  Assets:  {Assets (PAA):22698}  ADL's:  {BHH POL'I:10301}  Cognition: {chl bhh cognition:304700322}  Sleep:  {BHH GOOD/FAIR/POOR:22877}   Screenings: Camera operator Row Office Visit from 09/16/2022 in [place] at [facility] Visit from 08/25/2022 in [place] at [facility] Visit from 05/21/2022 in [place] at [facility] Visit from 03/05/2022 in [place] at [facility] Visit from 02/17/2022 in [place] at [facility]  PHQ-2 Total Score 0 0 '1 3 2  '$ PHQ-9 Total Score -- -- -- 15 5        Collaboration of Care: {BH OP Collaboration of Care:21014065}  Patient/Guardian was advised Release of Information must be obtained prior to any record release in order to collaborate their care with an outside provider.  Patient/Guardian was advised if they have not already done so to contact the registration department to sign all necessary forms in order for [place] to release information regarding their care.   Consent: Patient/Guardian gives verbal consent for treatment and assignment of benefits for services provided during this visit. Patient/Guardian expressed understanding and agreed to proceed.   Vista Mink, MD 1/8/20248:02 AM

## 2022-11-18 ENCOUNTER — Other Ambulatory Visit: Payer: Managed Care, Other (non HMO)

## 2022-11-26 ENCOUNTER — Ambulatory Visit: Payer: Managed Care, Other (non HMO) | Admitting: Emergency Medicine

## 2022-11-27 ENCOUNTER — Ambulatory Visit: Payer: Managed Care, Other (non HMO)

## 2022-11-27 ENCOUNTER — Ambulatory Visit
Admission: RE | Admit: 2022-11-27 | Discharge: 2022-11-27 | Disposition: A | Discharge status: Home / Self Care | Payer: Managed Care, Other (non HMO) | Source: Ambulatory Visit | Attending: Emergency Medicine | Admitting: Emergency Medicine

## 2022-11-27 DIAGNOSIS — N644 Mastodynia: Secondary | ICD-10-CM

## 2022-12-02 ENCOUNTER — Ambulatory Visit: Payer: Managed Care, Other (non HMO) | Admitting: Emergency Medicine

## 2022-12-02 ENCOUNTER — Encounter: Payer: Self-pay | Admitting: Emergency Medicine

## 2022-12-02 VITALS — BP 110/72 | HR 55 | Temp 97.9°F | Ht 61.0 in | Wt 132.4 lb

## 2022-12-02 DIAGNOSIS — Z13220 Encounter for screening for lipoid disorders: Secondary | ICD-10-CM | POA: Diagnosis not present

## 2022-12-02 DIAGNOSIS — S39012A Strain of muscle, fascia and tendon of lower back, initial encounter: Secondary | ICD-10-CM | POA: Insufficient documentation

## 2022-12-02 DIAGNOSIS — Z1329 Encounter for screening for other suspected endocrine disorder: Secondary | ICD-10-CM | POA: Diagnosis not present

## 2022-12-02 DIAGNOSIS — M545 Low back pain, unspecified: Secondary | ICD-10-CM | POA: Diagnosis not present

## 2022-12-02 DIAGNOSIS — Z130 Encounter for screening for diseases of the blood and blood-forming organs and certain disorders involving the immune mechanism: Secondary | ICD-10-CM | POA: Diagnosis not present

## 2022-12-02 DIAGNOSIS — Z13228 Encounter for screening for other metabolic disorders: Secondary | ICD-10-CM | POA: Diagnosis not present

## 2022-12-02 DIAGNOSIS — Z0001 Encounter for general adult medical examination with abnormal findings: Secondary | ICD-10-CM

## 2022-12-02 NOTE — Assessment & Plan Note (Signed)
Mechanical in nature.  No red flag signs or symptoms. Pain management discussed.  May take Tylenol and or NSAIDs as needed May benefit from physical therapy.  Referral to sports medicine placed today.

## 2022-12-02 NOTE — Progress Notes (Signed)
Heather Navarro 52 y.o.   Chief Complaint  Patient presents with   Annual Exam    Lower back pain patient unsure if she is having issues with her kidneys or she pulled a muscle, no urinary symptoms     HISTORY OF PRESENT ILLNESS: This is a 52 y.o. female here for annual exam. Concerned about pain to left lumbar area worse with movement for the last several weeks No associated symptoms.  Denies injuries. No other complaints or medical concerns today.  HPI   Prior to Admission medications   Not on File    Allergies  Allergen Reactions   Penicillins Swelling   Sulfa Antibiotics Itching   Latex Itching and Rash    Rash and itching  At times.    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  Social History   Socioeconomic History   Marital status: Married    Spouse name: Not on file   Number of children: 1   Years of education: Not on file   Highest education level: Not on file  Occupational History   Occupation: Geologist, engineering  Tobacco Use   Smoking status: Never   Smokeless  tobacco: Never  Vaping Use   Vaping Use: Never used  Substance and Sexual Activity   Alcohol use: Yes    Comment: socially   Drug use: No   Sexual activity: Yes    Birth control/protection: None    Comment: 1st intercourse- 23, partners- greater than 5  Other Topics Concern   Not on file  Social History Narrative   Not on file   Social Determinants of Health   Financial Resource Strain: Not on file  Food Insecurity: Not on file  Transportation Needs: Not on file  Physical Activity: Not on file  Stress: Not on file  Social Connections: Not on file  Intimate Partner Violence: Not on file    Family History  Problem Relation Age of Onset   Hypertension Mother    Cancer Mother        lung   Diabetes Father    Heart disease Father    Lupus Father    Heart disease Brother    Cancer Brother        sarcoma   Breast cancer Neg Hx    Esophageal cancer Neg Hx    Colon cancer Neg Hx      Review of Systems  Constitutional: Negative.  Negative for chills and fever.  HENT: Negative.  Negative for congestion and sore throat.  Respiratory: Negative.  Negative for cough and shortness of breath.   Gastrointestinal:  Negative for abdominal pain, nausea and vomiting.  Genitourinary: Negative.  Negative for dysuria, frequency, hematuria and urgency.  Musculoskeletal:  Positive for back pain.  Skin: Negative.  Negative for rash.  Neurological: Negative.  Negative for dizziness and headaches.  All other systems reviewed and are negative.  Today's Vitals   12/02/22 1543  BP: 110/72  Pulse: (!) 55  Temp: 97.9 F (36.6 C)  TempSrc: Oral  SpO2: 98%  Weight: 132 lb 6 oz (60 kg)  Height: '5\' 1"'$  (1.549 m)   Body mass index is 25.01 kg/m.   Physical Exam Vitals reviewed.  Constitutional:      Appearance: Normal appearance.  HENT:     Head: Normocephalic.     Right Ear: Tympanic membrane, ear canal and external ear normal.     Left Ear: Tympanic membrane, ear canal and  external ear normal.     Mouth/Throat:     Mouth: Mucous membranes are moist.     Pharynx: Oropharynx is clear.  Eyes:     Extraocular Movements: Extraocular movements intact.     Conjunctiva/sclera: Conjunctivae normal.     Pupils: Pupils are equal, round, and reactive to light.  Cardiovascular:     Rate and Rhythm: Normal rate and regular rhythm.     Pulses: Normal pulses.     Heart sounds: Normal heart sounds.  Pulmonary:     Effort: Pulmonary effort is normal.     Breath sounds: Normal breath sounds.  Abdominal:     Palpations: Abdomen is soft.     Tenderness: There is no abdominal tenderness.  Musculoskeletal:     Cervical back: No tenderness.     Right lower leg: No edema.     Left lower leg: No edema.  Lymphadenopathy:     Cervical: No cervical adenopathy.  Skin:    General: Skin is warm and dry.     Capillary Refill: Capillary refill takes less than 2 seconds.  Neurological:     General: No focal deficit present.     Mental Status: She is alert and oriented to person, place, and time.  Psychiatric:        Mood and Affect: Mood normal.        Behavior: Behavior normal.      ASSESSMENT & PLAN: Problem List Items Addressed This Visit       Musculoskeletal and Integument   Lumbar strain, initial encounter    Most likely related to activities of daily living Pain management discussed May benefit from physical therapy Referral to sports medicine placed today.      Relevant Orders   Ambulatory referral to Sports Medicine     Other   Lumbar pain    Mechanical in nature.  No red flag signs or symptoms. Pain management discussed.  May take Tylenol and or NSAIDs as needed May benefit from physical therapy.  Referral to sports medicine placed today.      Relevant Orders   Urinalysis   Other Visit Diagnoses     Encounter for general adult medical examination with abnormal findings    -  Primary   Relevant Orders   CBC with Differential   Comprehensive  metabolic panel   Hemoglobin A1c   Lipid panel   TSH   Screening for deficiency anemia       Relevant Orders   CBC with Differential   Screening for lipoid disorders  Relevant Orders   Lipid panel   Screening for endocrine, metabolic and immunity disorder       Relevant Orders   Comprehensive metabolic panel   Hemoglobin A1c   TSH      Modifiable risk factors discussed with patient. Anticipatory guidance according to age provided. The following topics were also discussed: Social Determinants of Health Smoking.  Non-smoker Diet and nutrition.  Good eating habits Benefits of exercise Cancer screening and review of colonoscopy and mammogram reports from 2023 Vaccinations review and recommendations Cardiovascular risk assessment and need for blood work The 10-year ASCVD risk score (Arnett DK, et al., 2019) is: 1.5%   Values used to calculate the score:     Age: 42 years     Sex: Female     Is Non-Hispanic African American: No     Diabetic: No     Tobacco smoker: No     Systolic Blood Pressure: [id number] mmHg     Is BP treated: No     HDL Cholesterol: 62.8 mg/dL     Total Cholesterol: 266 mg/dL Differential diagnosis of lumbar pain Pain management Referral to sports medicine and physical therapy Mental health including depression and anxiety Fall and accident prevention  Patient Instructions  Mantenimiento de Technical sales engineer en [place] Maintenance, Female Adoptar un estilo de vida saludable y recibir atencin preventiva son importantes para promover la salud y Musician. Consulte al mdico sobre: El esquema adecuado para hacerse pruebas y exmenes peridicos. Cosas que puede hacer por su cuenta para prevenir enfermedades y [place] sano. Qu debo saber sobre la dieta, el peso y el ejercicio? Consuma una dieta saludable  Consuma una dieta que incluya muchas verduras, frutas, productos lcteos con bajo contenido de [place] y Advertising account planner. No consuma muchos  alimentos ricos en grasas slidas, azcares agregados o sodio. Mantenga un peso saludable El ndice de masa muscular [facility]) se [place] and the [place] para identificar problemas de [place]. Proporciona una estimacin de la grasa corporal basndose en el peso y la altura. Su mdico puede ayudarle a Radiation protection practitioner [place] y a Scientist, forensic o Theatre manager un peso saludable. Haga ejercicio con regularidad Haga ejercicio con regularidad. Esta es una de las prcticas ms importantes que puede hacer por su salud. La [employer] de los adultos deben seguir estas pautas: Optometrist, al menos, 150 minutos de actividad fsica por semana. El ejercicio debe aumentar la frecuencia cardaca y Nature conservation officer transpirar (ejercicio de intensidad moderada). Hacer ejercicios de fortalecimiento por lo [employer] por semana. Agregue esto a su plan de ejercicio de intensidad moderada. Pase menos tiempo sentada. Incluso la actividad fsica ligera puede ser beneficiosa. Controle sus niveles de colesterol y lpidos en la sangre Comience a realizarse anlisis de lpidos y Research officer, trade union en la sangre a los 72 aos y luego reptalos cada 5 aos. Hgase controlar los niveles de colesterol con mayor frecuencia si: Sus niveles de lpidos y colesterol son altos. Es mayor de 59 aos. Presenta un alto riesgo de padecer enfermedades cardacas. Qu debo saber sobre las pruebas de deteccin del cncer? Segn su historia clnica y sus antecedentes familiares, es posible que deba realizarse pruebas de deteccin del cncer en diferentes edades. Esto puede incluir pruebas de deteccin de lo siguiente: Cncer de mama. Cncer de cuello uterino. Cncer colorrectal. Cncer de piel. Cncer de pulmn. Qu debo saber sobre la enfermedad cardaca, la diabetes y la hipertensin arterial? Presin arterial y enfermedad cardaca La hipertensin arterial causa enfermedades cardacas y [place] el riesgo de accidente cerebrovascular. Es  ms probable que esto se manifieste en las personas que  tienen lecturas de presin arterial alta o tienen sobrepeso. Hgase controlar la presin arterial: Cada 3 a 5 aos si tiene entre 18 y 33 aos. Todos los aos si es mayor de 40 aos. Diabetes Realcese exmenes de deteccin de la diabetes con regularidad. Este anlisis revisa el nivel de azcar en la sangre en [place]. Hgase las pruebas de deteccin: Cada tres aos despus de los 32 aos de edad si tiene un peso normal y un bajo riesgo de padecer diabetes. Con ms frecuencia y a partir de [place] edad inferior si tiene sobrepeso o un alto riesgo de padecer diabetes. Qu debo saber sobre la prevencin de infecciones? Hepatitis B Si tiene un riesgo ms alto de contraer hepatitis B, debe someterse a un examen de deteccin de este virus. Hable con el mdico para averiguar si tiene riesgo de contraer la infeccin por hepatitis B. Hepatitis C Se recomienda el anlisis a: [employer] 1945 y 1965. Todas las personas que tengan un riesgo de haber contrado hepatitis C. Enfermedades de transmisin sexual (ETS) Hgase las pruebas de Programme researcher, broadcasting/film/video de ITS, incluidas la gonorrea y la clamidia, si: Es sexualmente activa y es menor de 68 aos. Es mayor de 38 aos, y Investment banker, operational informa que corre riesgo de tener este tipo de infecciones. La actividad sexual ha cambiado desde que le hicieron la ltima prueba de deteccin y tiene un riesgo mayor de Best boy clamidia o Radio broadcast assistant. Pregntele al mdico si usted tiene riesgo. Pregntele al mdico si usted tiene un alto riesgo de Museum/gallery curator VIH. El mdico tambin puede recomendarle un medicamento recetado para ayudar a evitar la infeccin por el VIH. Si elige tomar medicamentos para prevenir el VIH, primero debe [place] de deteccin del VIH. Luego debe hacerse anlisis cada 3 meses mientras est tomando los medicamentos. Embarazo Si est por dejar de Librarian, academic (fase premenopusica) y usted puede quedar [place], busque asesoramiento antes de [place]. Tome de 400 a 800 microgramos (mcg) de cido [employer] si [place]. Pida mtodos de control de la natalidad (anticonceptivos) si desea evitar un embarazo no deseado. Osteoporosis y [place] La osteoporosis es una enfermedad en la que los huesos pierden los minerales y la fuerza por el avance de la edad. El resultado pueden ser fracturas en los [place]. Si tiene 28 aos o ms, o si est en riesgo de sufrir osteoporosis y fracturas, pregunte a su mdico si debe: Hacerse pruebas de deteccin de prdida sea. Tomar un suplemento de calcio o de vitamina D para reducir el riesgo de fracturas. Recibir terapia de reemplazo hormonal (TRH) para tratar los sntomas de la menopausia. Siga estas indicaciones en su casa: Consumo de alcohol No beba alcohol si: Su mdico le indica no hacerlo. Est embarazada, puede estar embarazada o est tratando de [place]. Si bebe alcohol: Limite la cantidad que bebe a lo siguiente: De 0 a 1 bebida por da. Sepa cunta cantidad de alcohol hay en las bebidas que toma. En los [place], una medida equivale a una botella de cerveza de 12 oz (355 ml), un vaso de vino de 5 oz (148 ml) o un vaso de una bebida alcohlica de alta graduacin de 1 oz (44 ml). Estilo de vida No consuma ningn producto que contenga nicotina o tabaco. Estos productos incluyen cigarrillos, tabaco para Higher education careers adviser y aparatos de vapeo, como los Psychologist, sport and exercise. Si necesita ayuda para dejar de consumir  estos productos, consulte al mdico. No consuma drogas. No comparta agujas. Solicite ayuda a su mdico si necesita apoyo o informacin para abandonar las drogas. Indicaciones generales Realcese los estudios de rutina de la salud, dentales y de Public librarian. [place]. Infrmele a su mdico si: Se siente deprimida con frecuencia. Alguna vez ha sido vctima de [place] o no se siente seguro en su casa. Resumen Adoptar un estilo de  vida saludable y recibir atencin preventiva son importantes para promover la salud y Musician. Siga las instrucciones del mdico acerca de una dieta saludable, el ejercicio y la realizacin de pruebas o exmenes para Engineer, building services. Siga las instrucciones del mdico con respecto al control del colesterol y la presin arterial. Esta informacin no tiene Marine scientist el consejo del mdico. Asegrese de hacerle al mdico cualquier pregunta que tenga. Document Revised: 02/20/2021 Document Reviewed: 02/20/2021 Elsevier Patient Education  [place], MD [facility]

## 2022-12-02 NOTE — Assessment & Plan Note (Signed)
Most likely related to activities of daily living Pain management discussed May benefit from physical therapy Referral to sports medicine placed today.

## 2022-12-02 NOTE — Patient Instructions (Signed)

## 2022-12-03 LAB — URINALYSIS
Bilirubin Urine: NEGATIVE
Ketones, ur: NEGATIVE
Leukocytes,Ua: NEGATIVE
Nitrite: NEGATIVE
Specific Gravity, Urine: 1.01 (ref 1.000–1.030)
Total Protein, Urine: NEGATIVE
Urine Glucose: NEGATIVE
Urobilinogen, UA: 0.2 (ref 0.0–1.0)
pH: 6 (ref 5.0–8.0)

## 2022-12-03 LAB — LIPID PANEL
Cholesterol: 280 mg/dL — ABNORMAL HIGH (ref 0–200)
HDL: 66.1 mg/dL (ref >39.00)
LDL Cholesterol: 191 mg/dL — ABNORMAL HIGH (ref 0–99)
NonHDL: 213.67
Total CHOL/HDL Ratio: 4
Triglycerides: 113 mg/dL (ref 0.0–149.0)
VLDL: 22.6 mg/dL (ref 0.0–40.0)

## 2022-12-03 LAB — CBC WITH DIFFERENTIAL/PLATELET
Basophils Absolute: 0 10*3/uL (ref 0.0–0.1)
Basophils Relative: 0.9 % (ref 0.0–3.0)
Eosinophils Absolute: 0.1 10*3/uL (ref 0.0–0.7)
Eosinophils Relative: 1.5 % (ref 0.0–5.0)
HCT: 37.2 % (ref 36.0–46.0)
Hemoglobin: 12.2 g/dL (ref 12.0–15.0)
Lymphocytes Relative: 34.8 % (ref 12.0–46.0)
Lymphs Abs: 1.9 10*3/uL (ref 0.7–4.0)
MCHC: 32.7 g/dL (ref 30.0–36.0)
MCV: 82.7 fl (ref 78.0–100.0)
Monocytes Absolute: 0.4 10*3/uL (ref 0.1–1.0)
Monocytes Relative: 6.7 % (ref 3.0–12.0)
Neutro Abs: 3.1 10*3/uL (ref 1.4–7.7)
Neutrophils Relative %: 56.1 % (ref 43.0–77.0)
Platelets: 285 10*3/uL (ref 150.0–400.0)
RBC: 4.5 Mil/uL (ref 3.87–5.11)
RDW: 14.2 % (ref 11.5–15.5)
WBC: 5.5 10*3/uL (ref 4.0–10.5)

## 2022-12-03 LAB — COMPREHENSIVE METABOLIC PANEL
ALT: 15 U/L (ref 0–35)
AST: 19 U/L (ref 0–37)
Albumin: 4.4 g/dL (ref 3.5–5.2)
Alkaline Phosphatase: 111 U/L (ref 39–117)
BUN: 13 mg/dL (ref 6–23)
CO2: 27 mEq/L (ref 19–32)
Calcium: 11.2 mg/dL — ABNORMAL HIGH (ref 8.4–10.5)
Chloride: 104 mEq/L (ref 96–112)
Creatinine, Ser: 0.64 mg/dL (ref 0.40–1.20)
GFR: 100.89 mL/min (ref >60.00)
Glucose, Bld: 91 mg/dL (ref 70–99)
Potassium: 3.9 mEq/L (ref 3.5–5.1)
Sodium: 138 mEq/L (ref 135–145)
Total Bilirubin: 0.5 mg/dL (ref 0.2–1.2)
Total Protein: 7.6 g/dL (ref 6.0–8.3)

## 2022-12-03 LAB — HEMOGLOBIN A1C: Hgb A1c MFr Bld: 6.2 % (ref 4.6–6.5)

## 2022-12-03 LAB — TSH: TSH: 2.71 u[IU]/mL (ref 0.35–5.50)

## 2022-12-04 ENCOUNTER — Other Ambulatory Visit: Payer: Self-pay | Admitting: Emergency Medicine

## 2022-12-04 DIAGNOSIS — Z87442 Personal history of urinary calculi: Secondary | ICD-10-CM

## 2022-12-04 DIAGNOSIS — R109 Unspecified abdominal pain: Secondary | ICD-10-CM

## 2022-12-04 DIAGNOSIS — R3129 Other microscopic hematuria: Secondary | ICD-10-CM

## 2022-12-08 NOTE — Addendum Note (Signed)
Addended by: Davina Poke on: 12/08/2022 08:03 AM   Modules accepted: Orders, Level of Service

## 2022-12-09 NOTE — Progress Notes (Deleted)
    Benito Mccreedy D.[place] Phone: [phone]   Assessment and Plan:     There are no diagnoses linked to this encounter.  ***   Pertinent previous records reviewed include ***   Follow Up: ***     Subjective:   I, Heather Navarro, am serving as a Education administrator for Doctor Glennon Mac  Chief Complaint: low back pain   HPI:  12/16/2022 Patient is a 52 year old female complaining of low back pain. Patient states  Relevant Historical Information: ***  Additional pertinent review of systems negative.  No current outpatient medications on file.   Objective:     There were no vitals filed for this visit.    There is no height or weight on file to calculate BMI.    Physical Exam:    ***   Electronically signed by:  Benito Mccreedy D.[facility] 7:21 AM 12/09/22

## 2022-12-16 ENCOUNTER — Ambulatory Visit: Payer: Managed Care, Other (non HMO) | Admitting: Sports Medicine

## 2023-01-05 NOTE — Progress Notes (Unsigned)
    Aleen Sells D.[facility] [address] [place] [address] Phone: [phone]   Assessment and Plan:     There are no diagnoses linked to this encounter.  ***   Pertinent previous records reviewed include ***   Follow Up: ***     Subjective:   I, Arrick Dutton, am serving as a Neurosurgeon for Doctor Richardean Sale  Chief Complaint: low back pain   HPI:   01/06/2023 Patient is a 52 year old female complaining of low back pain. Patient states  Relevant Historical Information: ***  Additional pertinent review of systems negative.  No current outpatient medications on file.   Objective:     There were no vitals filed for this visit.    There is no height or weight on file to calculate BMI.    Physical Exam:    ***   Electronically signed by:  Aleen Sells D.[facility] 7:22 AM 01/05/23

## 2023-01-06 ENCOUNTER — Ambulatory Visit
Admission: RE | Admit: 2023-01-06 | Discharge: 2023-01-06 | Disposition: A | Discharge status: Home / Self Care | Payer: Managed Care, Other (non HMO) | Source: Ambulatory Visit | Attending: Emergency Medicine | Admitting: Emergency Medicine

## 2023-01-06 ENCOUNTER — Ambulatory Visit: Payer: Managed Care, Other (non HMO) | Admitting: Sports Medicine

## 2023-01-06 ENCOUNTER — Ambulatory Visit (INDEPENDENT_AMBULATORY_CARE_PROVIDER_SITE_OTHER): Payer: Managed Care, Other (non HMO)

## 2023-01-06 VITALS — BP 110/78 | HR 85 | Ht 61.0 in | Wt 132.0 lb

## 2023-01-06 DIAGNOSIS — R3129 Other microscopic hematuria: Secondary | ICD-10-CM

## 2023-01-06 DIAGNOSIS — M545 Low back pain, unspecified: Secondary | ICD-10-CM

## 2023-01-06 DIAGNOSIS — G8929 Other chronic pain: Secondary | ICD-10-CM | POA: Diagnosis not present

## 2023-01-06 DIAGNOSIS — Z87442 Personal history of urinary calculi: Secondary | ICD-10-CM

## 2023-01-06 DIAGNOSIS — R109 Unspecified abdominal pain: Secondary | ICD-10-CM

## 2023-01-06 MED ORDER — MELOXICAM 15 MG PO TABS: 15.0000 mg | ORAL_TABLET | Freq: Every day | ORAL | 0 refills | Status: DC

## 2023-01-06 NOTE — Patient Instructions (Addendum)
Good to see you  Low back HEP - Start meloxicam 15 mg daily x2 weeks.  May use Tylenol [id number] mg 2 to 3 times a day for breakthrough pain. Recommend stationary bike, elliptical, or water aerobics  3 week follow up

## 2023-01-07 ENCOUNTER — Other Ambulatory Visit: Payer: Self-pay | Admitting: Emergency Medicine

## 2023-01-07 DIAGNOSIS — K7689 Other specified diseases of liver: Secondary | ICD-10-CM

## 2023-01-07 DIAGNOSIS — R160 Hepatomegaly, not elsewhere classified: Secondary | ICD-10-CM

## 2023-01-15 ENCOUNTER — Telehealth: Payer: Self-pay | Admitting: *Deleted

## 2023-01-15 NOTE — Telephone Encounter (Signed)
Patient called and had questions pertaining to her labs back in March, She was concerned about her cholesterol level. She was wondering if she needs to take medication for this . Please advise

## 2023-01-17 NOTE — Telephone Encounter (Signed)
The 10-year ASCVD risk score (Arnett DK, et al., 2019) is: 1.5%   Values used to calculate the score:     Age: 52 years     Sex: Female     Is Non-Hispanic African American: No     Diabetic: No     Tobacco smoker: No     Systolic Blood Pressure: 110 mmHg     Is BP treated: No     HDL Cholesterol: 66.1 mg/dL     Total Cholesterol: 280 mg/dL Given her low percentage risk, I do not recommend cholesterol medication.  I do however recommend better diet and nutrition and more physical activity.  Thanks.

## 2023-01-18 NOTE — Telephone Encounter (Signed)
Patient returned call. She said she would try to callback around 3:20 today 01/18/23 since Heather Navarro was at lunch.

## 2023-01-18 NOTE — Telephone Encounter (Signed)
Called patient and left message for patient to call office in reference to her lab question

## 2023-01-25 ENCOUNTER — Other Ambulatory Visit (HOSPITAL_COMMUNITY)
Admission: RE | Admit: 2023-01-25 | Discharge: 2023-01-25 | Disposition: A | Discharge status: Home / Self Care | Payer: Managed Care, Other (non HMO) | Source: Ambulatory Visit | Attending: Family Medicine | Admitting: Family Medicine

## 2023-01-25 ENCOUNTER — Ambulatory Visit: Payer: Managed Care, Other (non HMO) | Admitting: Family Medicine

## 2023-01-25 ENCOUNTER — Encounter: Payer: Self-pay | Admitting: Family Medicine

## 2023-01-25 VITALS — BP 110/76 | HR 68 | Temp 97.7°F | Resp 20 | Ht 61.0 in | Wt 122.0 lb

## 2023-01-25 DIAGNOSIS — M549 Dorsalgia, unspecified: Secondary | ICD-10-CM | POA: Diagnosis not present

## 2023-01-25 DIAGNOSIS — R160 Hepatomegaly, not elsewhere classified: Secondary | ICD-10-CM

## 2023-01-25 DIAGNOSIS — R300 Dysuria: Secondary | ICD-10-CM

## 2023-01-25 DIAGNOSIS — K7689 Other specified diseases of liver: Secondary | ICD-10-CM

## 2023-01-25 DIAGNOSIS — E7800 Pure hypercholesterolemia, unspecified: Secondary | ICD-10-CM

## 2023-01-25 DIAGNOSIS — N898 Other specified noninflammatory disorders of vagina: Secondary | ICD-10-CM

## 2023-01-25 DIAGNOSIS — N3001 Acute cystitis with hematuria: Secondary | ICD-10-CM

## 2023-01-25 DIAGNOSIS — F439 Reaction to severe stress, unspecified: Secondary | ICD-10-CM

## 2023-01-25 LAB — POCT URINALYSIS DIPSTICK
Bilirubin, UA: NEGATIVE
Blood, UA: POSITIVE
Glucose, UA: NEGATIVE
Nitrite, UA: NEGATIVE
Protein, UA: NEGATIVE
Spec Grav, UA: 1.01 (ref 1.010–1.025)
Urobilinogen, UA: NEGATIVE E.U./dL — AB
pH, UA: 5 (ref 5.0–8.0)

## 2023-01-25 MED ORDER — CIPROFLOXACIN HCL 500 MG PO TABS: 500.0000 mg | ORAL_TABLET | Freq: Two times a day (BID) | ORAL | 0 refills | Status: DC

## 2023-01-25 NOTE — Progress Notes (Signed)
Assessment & Plan:  1. Acute cystitis with hematuria Education provided on UTIs. Encouraged adequate hydration.  - ciprofloxacin (CIPRO) 500 MG tablet; Take 1 tablet (500 mg total) by mouth 2 (two) times daily.  Dispense: 6 tablet; Refill: 0  2. Dysuria Results for orders placed or performed in visit on 01/25/23  POCT urinalysis dipstick  Result Value Ref Range   Color, UA yellow    Clarity, UA clear    Glucose, UA Negative Negative   Bilirubin, UA neg    Ketones, UA 80+++    Spec Grav, UA 1.010 1.010 - 1.025   Blood, UA pos    pH, UA 5.0 5.0 - 8.0   Protein, UA Negative Negative   Urobilinogen, UA negative (A) 0.2 or 1.0 E.U./dL   Nitrite, UA neg    Leukocytes, UA Small (1+) (A) Negative   Appearance     Odor    - POCT urinalysis dipstick - Urine Culture; Future - Urine Culture  3. Musculoskeletal back pain Reassurance provided that the pain she is experiencing is not her kidneys.  4. Vaginal discharge - Cervicovaginal ancillary only  5. Hypercholesterolemia Education provided on high cholesterol.  Referral placed to a nutritionist per her request.  Discussed ASCVD risk score and reassured her that a medication is not indicated at this time. - Amb ref to Medical Nutrition Therapy-MNT  6-7. Enlarged liver/Liver cyst Reviewed CT scan results with the patient.  Reassurance provided that most liver cysts are benign.  Encouraged her to keep her upcoming appointment for further workup.  8. Situational stress Patient states she is feeling much better after being able to discuss her conditions and get some answers.  However her workplace does offer counseling which she plans to enroll in.   Follow up plan: Return if symptoms worsen or fail to improve.  [name], MSN, APRN, FNP-C  Subjective:  HPI: Heather Navarro is a 52 y.o. female presenting on 01/25/2023 for Urinary Tract Infection (Started about 1 week ago: burning, frequency, urgency and pelvic pressure with  sitting//Also states some discharge - dark yellow )  Patient complains of dysuria, inability to void, suprapubic pressure, and urgency. She has had symptoms for 1 week. Patient also complains of back pain and vaginal discharge. Patient denies fever. Patient does have a history of recurrent UTI.  Patient does not have a history of pyelonephritis.      01/25/2023    8:25 AM 12/02/2022    3:43 PM 09/16/2022    3:20 PM  Depression screen PHQ 2/9  Decreased Interest 3 0 0  Down, Depressed, Hopeless 0 0 0  PHQ - 2 Score 3 0 0  Altered sleeping 1    Tired, decreased energy 3    Change in appetite 2    Feeling bad or failure about yourself  0    Trouble concentrating 2    Moving slowly or fidgety/restless 0    Suicidal thoughts 0    PHQ-9 Score 11    Difficult doing work/chores Somewhat difficult     Hypercholesterolemia: Patient is concerned that her cholesterol numbers continue to increase.  States she feels like she needs to be on a medication.   The 10-year ASCVD risk score (Arnett DK, et al., 2019) is: 1.5%   Values used to calculate the score:     Age: 31 years     Sex: Female     Is Non-Hispanic African American: No     Diabetic: No  Tobacco smoker: No     Systolic Blood Pressure: 110 mmHg     Is BP treated: No     HDL Cholesterol: 66.1 mg/dL     Total Cholesterol: 280 mg/dL   ROS: Negative unless specifically indicated above in HPI.   Relevant past medical history reviewed and updated as indicated.   Allergies and medications reviewed and updated.   Current Outpatient Medications:    ciprofloxacin (CIPRO) 500 MG tablet, Take 1 tablet (500 mg total) by mouth 2 (two) times daily., Disp: 6 tablet, Rfl: 0   meloxicam (MOBIC) 15 MG tablet, Take 1 tablet (15 mg total) by mouth daily., Disp: 14 tablet, Rfl: 0  Allergies  Allergen Reactions   Penicillins Swelling   Sulfa Antibiotics Itching   Latex Itching and Rash    Rash and itching  At times.    Macrobid  [Nitrofurantoin Monohyd Macro] Rash   Other Rash    Objective:   BP 110/76   Pulse 68   Temp 97.7 F (36.5 C)   Resp 20   Ht 5\' 1"  (1.549 m)   Wt 122 lb (55.3 kg)   LMP 10/03/2017   BMI 23.05 kg/m    Physical Exam Vitals reviewed.  Constitutional:      General: She is not in acute distress.    Appearance: Normal appearance. She is not ill-appearing, toxic-appearing or diaphoretic.  HENT:     Head: Normocephalic and atraumatic.  Eyes:     General: No scleral icterus.       Right eye: No discharge.        Left eye: No discharge.     Conjunctiva/sclera: Conjunctivae normal.  Cardiovascular:     Rate and Rhythm: Normal rate.  Pulmonary:     Effort: Pulmonary effort is normal. No respiratory distress.  Abdominal:     Tenderness: There is no right CVA tenderness or left CVA tenderness.  Musculoskeletal:        General: Normal range of motion.     Cervical back: Normal range of motion.     Thoracic back: Tenderness (muscular on the right side) present. No bony tenderness.  Skin:    General: Skin is warm and dry.     Capillary Refill: Capillary refill takes less than 2 seconds.  Neurological:     General: No focal deficit present.     Mental Status: She is alert and oriented to person, place, and time. Mental status is at baseline.  Psychiatric:        Mood and Affect: Mood normal.        Behavior: Behavior normal.        Thought Content: Thought content normal.        Judgment: Judgment normal.

## 2023-01-26 LAB — CERVICOVAGINAL ANCILLARY ONLY
Bacterial Vaginitis (gardnerella): NEGATIVE
Candida Glabrata: NEGATIVE
Candida Vaginitis: NEGATIVE
Chlamydia: NEGATIVE
Comment: NEGATIVE
Comment: NEGATIVE
Comment: NEGATIVE
Comment: NEGATIVE
Comment: NEGATIVE
Comment: NORMAL
Neisseria Gonorrhea: NEGATIVE
Trichomonas: NEGATIVE

## 2023-01-26 LAB — URINE CULTURE

## 2023-01-29 ENCOUNTER — Ambulatory Visit: Payer: Managed Care, Other (non HMO) | Admitting: Sports Medicine

## 2023-02-12 NOTE — Progress Notes (Unsigned)
    Aleen Sells D.[facility] [address] [place] [address] Phone: [phone]   Assessment and Plan:     There are no diagnoses linked to this encounter.  ***   Pertinent previous records reviewed include ***   Follow Up: ***     Subjective:   I, Kaylor Simenson, am serving as a Neurosurgeon for Doctor Richardean Sale   Chief Complaint: low back pain    HPI:    01/06/2023 Patient is a 52 year old female complaining of low back pain. 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   Current Outpatient Medications:    ciprofloxacin (CIPRO) 500 MG tablet, Take 1 tablet (500 mg total) by mouth 2 (two) times daily., Disp: 6 tablet, Rfl: 0   meloxicam (MOBIC) 15 MG tablet, Take 1 tablet (15 mg total) by mouth daily., Disp: 14 tablet, Rfl: 0   Objective:     There were no vitals filed for this visit.    There is no height or weight on file to calculate BMI.    Physical Exam:    ***   Electronically signed by:  Aleen Sells D[facility] 10:01 AM 02/12/23

## 2023-02-15 ENCOUNTER — Ambulatory Visit: Payer: Managed Care, Other (non HMO) | Admitting: Sports Medicine

## 2023-02-15 VITALS — BP 108/78 | HR 66 | Ht 61.0 in | Wt 126.0 lb

## 2023-02-15 DIAGNOSIS — M545 Low back pain, unspecified: Secondary | ICD-10-CM

## 2023-02-15 DIAGNOSIS — M79642 Pain in left hand: Secondary | ICD-10-CM

## 2023-02-15 DIAGNOSIS — G8929 Other chronic pain: Secondary | ICD-10-CM | POA: Diagnosis not present

## 2023-02-15 DIAGNOSIS — M542 Cervicalgia: Secondary | ICD-10-CM | POA: Diagnosis not present

## 2023-02-15 DIAGNOSIS — M79641 Pain in right hand: Secondary | ICD-10-CM

## 2023-02-15 NOTE — Patient Instructions (Addendum)
Good to see you  Neck HEP Tylenol [phone] mg 2-3 times a day for pain relief  Recommend Voltaren fel over of areas of pain  Warm hand baths for hand pain  Discontinue daily meloxicam  As needed follow up

## 2023-03-17 ENCOUNTER — Encounter: Payer: Self-pay | Admitting: Nurse Practitioner

## 2023-03-17 ENCOUNTER — Ambulatory Visit: Payer: Managed Care, Other (non HMO) | Admitting: Nurse Practitioner

## 2023-03-17 VITALS — BP 108/70 | HR 64 | Ht 61.5 in | Wt 132.0 lb

## 2023-03-17 DIAGNOSIS — N939 Abnormal uterine and vaginal bleeding, unspecified: Secondary | ICD-10-CM | POA: Diagnosis not present

## 2023-03-17 DIAGNOSIS — R82998 Other abnormal findings in urine: Secondary | ICD-10-CM

## 2023-03-17 DIAGNOSIS — R102 Pelvic and perineal pain: Secondary | ICD-10-CM | POA: Diagnosis not present

## 2023-03-17 DIAGNOSIS — Z01419 Encounter for gynecological examination (general) (routine) without abnormal findings: Secondary | ICD-10-CM | POA: Diagnosis not present

## 2023-03-17 DIAGNOSIS — R1031 Right lower quadrant pain: Secondary | ICD-10-CM

## 2023-03-17 LAB — URINALYSIS, COMPLETE W/RFL CULTURE
Bacteria, UA: NONE SEEN /HPF
Bilirubin Urine: NEGATIVE
Glucose, UA: NEGATIVE
Hyaline Cast: NONE SEEN /LPF
Leukocyte Esterase: NEGATIVE
Nitrites, Initial: NEGATIVE
Protein, ur: NEGATIVE
RBC / HPF: NONE SEEN /HPF (ref 0–2)
Specific Gravity, Urine: 1.025 (ref 1.001–1.035)
WBC, UA: NONE SEEN /HPF (ref 0–5)
pH: 5.5 (ref 5.0–8.0)

## 2023-03-17 LAB — NO CULTURE INDICATED

## 2023-03-17 NOTE — Progress Notes (Signed)
Heather Navarro 03-21-1971 [id number]   History:  53 y.o. G1P0101 presents as newly established patient. Last seen in our office in 04-02-18. Postmenopausal x 2-3 years, no HRT. Reports very light vaginal bleeding in May that lasted days. In April she was evaluated for flank pain and blood in urine. Blood in urine, negative renal stone study. H/O kidney stones, most recent in 04/03/2007. Having pain in RLQ that radiates to right flank that comes and goes. See GI tomorrow for enlarged liver and liver cysts. Reports abnormal pap many years ago, no intervention. H/O HSV 2, rare outbreaks, Valtrex as needed.   Gynecologic History Patient's last menstrual period was 10/03/2017.   Contraception/Family planning: post menopausal status Sexually active: No  Health Maintenance Last Pap: 10/23/2021. Results were: Normal neg HPV Last mammogram: 02/17/2022. Results were: Normal Last colonoscopy: 09/10/2022 Last Dexa: Not indicated   Past medical history, past surgical history, family history and social history were all reviewed and documented in the EPIC chart.  ROS:  A ROS was performed and pertinent positives and negatives are included.  Exam:  Vitals:   03/17/23 1428  BP: 108/70  Pulse: 64  SpO2: 100%  Weight: 132 lb (59.9 kg)  Height: 5' 1.5" (1.562 m)   Body mass index is 24.54 kg/m.  General appearance:  Normal Thyroid:  Symmetrical, normal in size, without palpable masses or nodularity. Respiratory  Auscultation:  Clear without wheezing or rhonchi Cardiovascular  Auscultation:  Regular rate, without rubs, murmurs or gallops  Edema/varicosities:  Not grossly evident Abdominal  Soft,nontender, without masses, guarding or rebound.  Liver/spleen:  No organomegaly noted  Hernia:  None appreciated  Skin  Inspection:  Grossly normal Breasts: Examined lying and sitting.   Right: Without masses, retractions, nipple discharge or axillary adenopathy.   Left: Without masses, retractions,  nipple discharge or axillary adenopathy. Genitourinary   Inguinal/mons:  Normal without inguinal adenopathy  External genitalia:  Normal appearing vulva with no masses, tenderness, or lesions  BUS/Urethra/Skene's glands:  Normal  Vagina:  Normal appearing with normal color and discharge, no lesions. Atrophic changes  Cervix:  Normal appearing without discharge or lesions  Uterus:  Normal in size, shape and contour.  Midline and mobile, nontender  Adnexa/parametria:     Rt: Normal in size, without masses or tenderness.   Lt: Normal in size, without masses or tenderness.  Anus and perineum: Normal  Digital rectal exam: Deferred  Patient informed chaperone available to be present for breast and pelvic exam. Patient has requested no chaperone to be present. Patient has been advised what will be completed during breast and pelvic exam.   UA: negative leukocytes, trace blood, neg nitrites, neg protein, yellow/cloudy. Microscopic: wbc none, rbc none, many calcium oxalate  Assessment/Plan:  52 y.o. G1P0101 to establish care.   Well female exam with routine gynecological exam - Education provided on SBEs, importance of preventative screenings, current guidelines, high calcium diet, regular exercise, and multivitamin daily.  Labs with PCP.   Right lower quadrant pain - Plan: US PELVIS TRANSVAGINAL NON-OB (TV ONLY). Could be from possible kidney stone.   Vaginal bleeding - Plan: US PELVIS TRANSVAGINAL NON-OB (TV ONLY). See HPI.   Pelvic pressure in female - Plan: Urinalysis,Complete w/RFL Culture. Negative for infection, calcium oxalate present.   Calcium oxalate crystals in urine - possible kidney stone. H/O kidney stones, most recently in 04/03/07. Has always passed on her own. Negative renal study in April when she was also having symptoms. If severe pain or  difficulty urinating she is aware to go to ER.   Screening for cervical cancer - Normal Pap history.  Will repeat at 5-year interval per  guidelines.  Screening for breast cancer - Normal mammogram history.  Continue annual screenings.  Normal breast exam today.  Screening for colon cancer - 08/2022 colonoscopy. Will repeat at GI's recommended interval.   Screening for osteoporosis - Average risk. Will plan DXA at age 61.   Return in 1 year for annual.       Olivia Mackie DNP, 3:37 PM 03/17/2023

## 2023-03-18 ENCOUNTER — Other Ambulatory Visit (INDEPENDENT_AMBULATORY_CARE_PROVIDER_SITE_OTHER): Payer: Managed Care, Other (non HMO)

## 2023-03-18 ENCOUNTER — Ambulatory Visit: Payer: Managed Care, Other (non HMO) | Admitting: Gastroenterology

## 2023-03-18 ENCOUNTER — Encounter: Payer: Self-pay | Admitting: Gastroenterology

## 2023-03-18 VITALS — BP 100/76 | HR 64 | Ht 61.0 in | Wt 123.0 lb

## 2023-03-18 DIAGNOSIS — R932 Abnormal findings on diagnostic imaging of liver and biliary tract: Secondary | ICD-10-CM | POA: Diagnosis not present

## 2023-03-18 DIAGNOSIS — K219 Gastro-esophageal reflux disease without esophagitis: Secondary | ICD-10-CM | POA: Diagnosis not present

## 2023-03-18 LAB — COMPREHENSIVE METABOLIC PANEL
ALT: 18 U/L (ref 0–35)
AST: 16 U/L (ref 0–37)
Albumin: 4.2 g/dL (ref 3.5–5.2)
Alkaline Phosphatase: 114 U/L (ref 39–117)
BUN: 22 mg/dL (ref 6–23)
CO2: 26 mEq/L (ref 19–32)
Calcium: 10.1 mg/dL (ref 8.4–10.5)
Chloride: 107 mEq/L (ref 96–112)
Creatinine, Ser: 0.62 mg/dL (ref 0.40–1.20)
GFR: 101.46 mL/min (ref >60.00)
Glucose, Bld: 98 mg/dL (ref 70–99)
Potassium: 4.2 mEq/L (ref 3.5–5.1)
Sodium: 140 mEq/L (ref 135–145)
Total Bilirubin: 0.5 mg/dL (ref 0.2–1.2)
Total Protein: 7.5 g/dL (ref 6.0–8.3)

## 2023-03-18 LAB — PROTIME-INR
INR: 1.1 ratio — ABNORMAL HIGH (ref 0.8–1.0)
Prothrombin Time: 11.8 s (ref 9.6–13.1)

## 2023-03-18 NOTE — Patient Instructions (Signed)
Your provider has requested that you go to the basement level for lab work before leaving today. Press "B" on the elevator. The lab is located at the first door on the left as you exit the elevator.  You have been scheduled for an MRI at [facility] on 03/26/23. Your appointment time is 7:00pm. Please arrive to admitting (at main entrance of the hospital) 30 minutes prior to your appointment time for registration purposes. Please make certain not to have anything to eat or drink 4 hours prior to your test. In addition, if you have any metal in your body, have a pacemaker or defibrillator, please be sure to let your ordering physician know. This test typically takes 45 minutes to 1 hour to complete. Should you need to reschedule, please call [phone] to do so.  Due to recent changes in healthcare laws, you may see the results of your imaging and laboratory studies on MyChart before your provider has had a chance to review them.  We understand that in some cases there may be results that are confusing or concerning to you. Not all laboratory results come back in the same time frame and the provider may be waiting for multiple results in order to interpret others.  Please give [place] 48 hours in order for your provider to thoroughly review all the results before contacting the office for clarification of your results.   The [facility] providers would like to encourage you to use [facility] to communicate with providers for non-urgent requests or questions.  Due to long hold times on the telephone, sending your provider a message by [facility] may be a faster and more efficient way to get a response.  Please allow 48 business hours for a response.  Please remember that this is for non-urgent requests.   Thank you for choosing me and [facility].  Venita Lick. Pleas Koch., MD., Clementeen Graham

## 2023-03-18 NOTE — Progress Notes (Signed)
Assessment     Prominent liver - mild hepatomegaly and benign appearing hepatic cysts GERD Constipation Average risk CRC screening History of B12 deficiency and IDA Back pain and right flank pain c/w musculoskeletal cause   Recommendations    Schedule hepatic MRI to further evaluate CMP, PT/INR today Continue pantoprazole 40 mg daily as needed and follow antireflux measures Continue MiraLAX daily as needed Screening colonoscopy recommended in December 2033   HPI    This is a 52 year old female referred for evaluation of an enlarged liver and hepatic cysts on CT.  Hepatic findings were noted on a renal stone CT scan.  Her LFTs have been normal with most recent numbers in March.  She has had an intermittent elevated calcium.  Her reflux symptoms and constipation have been under good control. She has intermittent sharp back pain and right flank pain c/w musculoskeletal    Labs / Imaging       Latest Ref Rng & Units 12/02/2022    4:23 PM 05/21/2022    4:23 PM 03/05/2022    9:26 AM  Hepatic Function  Total Protein 6.0 - 8.3 g/dL 7.6  7.1  7.4   Albumin 3.5 - 5.2 g/dL 4.4  4.2  4.3   AST 0 - 37 U/L 19  15  15    ALT 0 - 35 U/L 15  15  16    Alk Phosphatase 39 - 117 U/L 111  97  98   Total Bilirubin 0.2 - 1.2 mg/dL 0.5  0.4  0.5   Bilirubin, Direct 0.0 - 0.3 mg/dL   0.1        Latest Ref Rng & Units 12/02/2022    4:23 PM 05/21/2022    4:23 PM 03/05/2022    9:26 AM  CBC  WBC 4.0 - 10.5 K/uL 5.5  5.2  3.7   Hemoglobin 12.0 - 15.0 g/dL 16.1  09.6  04.5   Hematocrit 36.0 - 46.0 % 37.2  35.6  36.8   Platelets 150.0 - 400.0 K/uL 285.0  268.0  262.0      DG Lumbar Spine 2-3 Views CLINICAL DATA:  Back pain  EXAM: LUMBAR SPINE - 3 VIEW  COMPARISON:  None Available.  FINDINGS: There is no evidence of lumbar spine fracture. Alignment is normal. Intervertebral disc spaces are maintained.  IMPRESSION: Negative.  Electronically Signed   By: Layla Maw M.D.   On:  01/07/2023 21:49 CT RENAL STONE STUDY CLINICAL DATA:  Flank pain  EXAM: CT ABDOMEN AND PELVIS WITHOUT CONTRAST  TECHNIQUE: Multidetector CT imaging of the abdomen and pelvis was performed following the standard protocol without IV contrast.  RADIATION DOSE REDUCTION: This exam was performed according to the departmental dose-optimization program which includes automated exposure control, adjustment of the mA and/or kV according to patient size and/or use of iterative reconstruction technique.  COMPARISON:  None Available.  FINDINGS: Lower chest: Minimal dependent subsegmental atelectasis identified. No pneumothorax or pleural effusion. No pericardial effusion.  Hepatobiliary: Liver is prominent up to 26 cm. Innumerable hypodensities in the liver consistent with cysts. The largest is in the lateral left lobe measuring 4 cm.  Pancreas: Unremarkable. No pancreatic ductal dilatation or surrounding inflammatory changes.  Spleen: Normal in size without focal abnormality.  Adrenals/Urinary Tract: Adrenal glands are unremarkable. Kidneys are normal, without renal calculi, focal lesion, or hydronephrosis. Bladder is unremarkable. Punctate calcification in the pelvis on the right might be in the distal right ureter but there is no hydronephrosis.  Stomach/Bowel: Stomach is within normal limits. Appendix appears normal. No evidence of bowel wall thickening, distention, or inflammatory changes.  Vascular/Lymphatic: No significant vascular findings are present. No enlarged abdominal or pelvic lymph nodes.  Reproductive: Uterus and bilateral adnexa are unremarkable.  Other: Small periumbilical abdominal wall defect containing omental fat. No evidence of free air or fluid.  Musculoskeletal: No acute or significant osseous findings.  IMPRESSION: 1. Enlarged liver with innumerable cysts. 2. Tiny calcification in the pelvis that might be in the distal right ureter. No  hydronephrosis.  Electronically Signed   By: Layla Maw M.D.   On: 01/07/2023 13:40   Current Medications, Allergies, Past Medical History, Past Surgical History, Family History and Social History were reviewed in [employer] record.   Physical Exam: General: Well developed, well nourished, no acute distress Head: Normocephalic and atraumatic Eyes: Sclerae anicteric, EOMI Ears: Normal auditory acuity Mouth: No deformities or lesions noted Lungs: Clear throughout to auscultation Heart: Regular rate and rhythm; No murmurs, rubs or bruits Abdomen: Soft, non tender and non distended. No masses, hepatosplenomegaly or hernias noted. Normal Bowel sounds Rectal: Not done (see recent colonoscopy) Musculoskeletal: Symmetrical with no gross deformities  Pulses:  Normal pulses noted Extremities: No edema or deformities noted Neurological: Alert oriented x 4, grossly nonfocal Psychological:  Alert and cooperative. Normal mood and affect   Heather Kettering T. Russella Dar, MD 03/18/2023, 8:20 AM

## 2023-03-19 ENCOUNTER — Telehealth: Payer: Self-pay | Admitting: Gastroenterology

## 2023-03-19 NOTE — Telephone Encounter (Signed)
Patient is calling states she is returning a call from [place].

## 2023-03-19 NOTE — Telephone Encounter (Signed)
Left message for patient to call back. Looks like Heather Navarro was trying to reach her to discuss recent labwork. See lab results notes form 03/18/23 for additional documentation.

## 2023-03-26 ENCOUNTER — Ambulatory Visit (HOSPITAL_COMMUNITY): Admission: RE | Admit: 2023-03-26 | Payer: Managed Care, Other (non HMO) | Source: Ambulatory Visit

## 2023-04-11 ENCOUNTER — Ambulatory Visit (HOSPITAL_COMMUNITY)
Admission: RE | Admit: 2023-04-11 | Discharge: 2023-04-11 | Disposition: A | Discharge status: Home / Self Care | Payer: Managed Care, Other (non HMO) | Source: Ambulatory Visit | Attending: Gastroenterology | Admitting: Gastroenterology

## 2023-04-11 DIAGNOSIS — R932 Abnormal findings on diagnostic imaging of liver and biliary tract: Secondary | ICD-10-CM | POA: Diagnosis present

## 2023-04-11 MED ORDER — GADOBUTROL 1 MMOL/ML IV SOLN
5.5000 mL | Freq: Once | INTRAVENOUS | Status: AC | PRN
  Administered 2023-04-11: 5.5 mL via INTRAVENOUS

## 2023-04-28 NOTE — Progress Notes (Unsigned)
   Acute Office Visit  Subjective:    Patient ID: Heather Navarro, female    DOB: March 03, 1971, 52 y.o.   MRN: [id number]   HPI 52 y.o. presents today for ultrasound for PMB and RLQ pain. Seen 03/17/23 with complaints of very light vaginal bleeding in May that lasted days. Postmenopausal for 2-3 years. In April she was evaluated for flank pain and blood in urine. Blood in urine, negative renal stone study. UA 03/17/23 negative for blood but many calcium oxalate present. Having pain in RLQ that radiates to right flank that comes and goes.   Patient's last menstrual period was 10/03/2017.    Review of Systems     Objective:    Physical Exam  LMP 10/03/2017  Wt Readings from Last 3 Encounters:  03/18/23 123 lb (55.8 kg)  03/17/23 132 lb (59.9 kg)  02/15/23 126 lb (57.2 kg)        Patient informed chaperone available to be present for breast and/or pelvic exam. Patient has requested no chaperone to be present. Patient has been advised what will be completed during breast and pelvic exam.   Assessment & Plan:   Problem List Items Addressed This Visit   None Visit Diagnoses     Postmenopausal bleeding    -  Primary           Olivia Mackie DNP, 3:11 PM 04/28/2023

## 2023-04-29 ENCOUNTER — Other Ambulatory Visit: Payer: Managed Care, Other (non HMO)

## 2023-04-29 ENCOUNTER — Ambulatory Visit (INDEPENDENT_AMBULATORY_CARE_PROVIDER_SITE_OTHER): Payer: Managed Care, Other (non HMO) | Admitting: Nurse Practitioner

## 2023-04-29 ENCOUNTER — Ambulatory Visit (INDEPENDENT_AMBULATORY_CARE_PROVIDER_SITE_OTHER): Payer: Managed Care, Other (non HMO)

## 2023-04-29 ENCOUNTER — Encounter: Payer: Self-pay | Admitting: Nurse Practitioner

## 2023-04-29 VITALS — BP 128/74 | HR 64 | Wt 126.0 lb

## 2023-04-29 DIAGNOSIS — R109 Unspecified abdominal pain: Secondary | ICD-10-CM

## 2023-04-29 DIAGNOSIS — R1031 Right lower quadrant pain: Secondary | ICD-10-CM

## 2023-04-29 DIAGNOSIS — N939 Abnormal uterine and vaginal bleeding, unspecified: Secondary | ICD-10-CM | POA: Diagnosis not present

## 2023-04-29 DIAGNOSIS — R102 Pelvic and perineal pain: Secondary | ICD-10-CM

## 2023-04-29 DIAGNOSIS — N950 Postmenopausal bleeding: Secondary | ICD-10-CM

## 2023-09-28 ENCOUNTER — Encounter: Payer: Self-pay | Admitting: Family Medicine

## 2023-09-28 ENCOUNTER — Ambulatory Visit: Payer: Managed Care, Other (non HMO) | Admitting: Family Medicine

## 2023-09-28 VITALS — BP 92/68 | HR 70 | Temp 98.0°F | Ht 61.0 in | Wt 124.0 lb

## 2023-09-28 DIAGNOSIS — R519 Headache, unspecified: Secondary | ICD-10-CM | POA: Diagnosis not present

## 2023-09-28 DIAGNOSIS — R509 Fever, unspecified: Secondary | ICD-10-CM

## 2023-09-28 DIAGNOSIS — R0981 Nasal congestion: Secondary | ICD-10-CM | POA: Diagnosis not present

## 2023-09-28 DIAGNOSIS — R10817 Generalized abdominal tenderness: Secondary | ICD-10-CM

## 2023-09-28 DIAGNOSIS — R197 Diarrhea, unspecified: Secondary | ICD-10-CM | POA: Diagnosis not present

## 2023-09-28 DIAGNOSIS — R109 Unspecified abdominal pain: Secondary | ICD-10-CM

## 2023-09-28 LAB — CBC WITH DIFFERENTIAL/PLATELET
Basophils Absolute: 0 10*3/uL (ref 0.0–0.1)
Basophils Relative: 0.3 % (ref 0.0–3.0)
Eosinophils Absolute: 0.1 10*3/uL (ref 0.0–0.7)
Eosinophils Relative: 1.9 % (ref 0.0–5.0)
HCT: 36.9 % (ref 36.0–46.0)
Hemoglobin: 11.8 g/dL — ABNORMAL LOW (ref 12.0–15.0)
Lymphocytes Relative: 22.1 % (ref 12.0–46.0)
Lymphs Abs: 1.1 10*3/uL (ref 0.7–4.0)
MCHC: 32 g/dL (ref 30.0–36.0)
MCV: 84.6 fL (ref 78.0–100.0)
Monocytes Absolute: 0.6 10*3/uL (ref 0.1–1.0)
Monocytes Relative: 12.1 % — ABNORMAL HIGH (ref 3.0–12.0)
Neutro Abs: 3.2 10*3/uL (ref 1.4–7.7)
Neutrophils Relative %: 63.6 % (ref 43.0–77.0)
Platelets: 263 10*3/uL (ref 150.0–400.0)
RBC: 4.36 Mil/uL (ref 3.87–5.11)
RDW: 14 % (ref 11.5–15.5)
WBC: 5.1 10*3/uL (ref 4.0–10.5)

## 2023-09-28 LAB — COMPREHENSIVE METABOLIC PANEL
ALT: 28 U/L (ref 0–35)
AST: 20 U/L (ref 0–37)
Albumin: 4.2 g/dL (ref 3.5–5.2)
Alkaline Phosphatase: 107 U/L (ref 39–117)
BUN: 10 mg/dL (ref 6–23)
CO2: 27 meq/L (ref 19–32)
Calcium: 10.4 mg/dL (ref 8.4–10.5)
Chloride: 105 meq/L (ref 96–112)
Creatinine, Ser: 0.58 mg/dL (ref 0.40–1.20)
GFR: 102.72 mL/min (ref >60.00)
Glucose, Bld: 95 mg/dL (ref 70–99)
Potassium: 4 meq/L (ref 3.5–5.1)
Sodium: 139 meq/L (ref 135–145)
Total Bilirubin: 0.3 mg/dL (ref 0.2–1.2)
Total Protein: 7.5 g/dL (ref 6.0–8.3)

## 2023-09-28 LAB — LIPASE: Lipase: 9 U/L — ABNORMAL LOW (ref 11.0–59.0)

## 2023-09-28 LAB — POC COVID19 BINAXNOW: SARS Coronavirus 2 Ag: NEGATIVE

## 2023-09-28 NOTE — Patient Instructions (Addendum)
 Please go downstairs for labs before you leave.  Stay well-hydrated.  Continue treating your symptoms with over-the-counter medications as well as Tylenol if needed for fever and headache.   I recommend starting a daily probiotic such as align, digestive health or others.  You can take this for 4 weeks and then stop.  Let us  know if any of your symptoms are getting much worse or if not back to baseline in the next 1 to 2 weeks.

## 2023-09-28 NOTE — Progress Notes (Signed)
 Subjective:     Patient ID: Heather Navarro, female    DOB: 1971-01-08, 52 y.o.   MRN: [id number]  Chief Complaint  Patient presents with   Fever    3-4 days ago, 101 last night   Diarrhea    Last Thursday 26th came back from [place] and had diarrhea, but not as bad as before. Still having pains when she eats and stomach rumbling    Eye Problem    Yesterday woke up and eye was red, felt like sand was in it and it woke up feeling sticky. Didn't feel sticky this morning but still bothering her    Fever  Associated symptoms include abdominal pain, congestion, diarrhea and ear pain. Pertinent negatives include no chest pain, coughing, nausea, sore throat, urinary pain or vomiting.  Diarrhea  Associated symptoms include abdominal pain and a fever. Pertinent negatives include no chills, coughing, myalgias or vomiting.  Eye Problem  Associated symptoms include an eye discharge and a fever. Pertinent negatives include no blurred vision, double vision, nausea, photophobia or vomiting.     History of Present Illness          Here with several concerns.   C/o URI symptoms started on 09/23/2023, headache and fever. Sneezing, congestion.   She was taking Dayquil and Nyquil.   Fever up to 101 at home last night, none today.   Diarrhea on 09/22/23 and 09/23/23, resolved.   Stomach cramping with eating.   Eye drainage clear, worse on right. Improving.   Recent travel to [place]   Denies chills, dizziness, fatigue, ST, chest pain, palpitations, shortness of breath, cough, abdominal pain, N/V/D, urinary symptoms, LE edema.     Health Maintenance Due  Topic Date Due   Zoster Vaccines- Shingrix (1 of 2) Never done   MAMMOGRAM  02/18/2023   INFLUENZA VACCINE  Never done   COVID-19 Vaccine (4 - 2024-25 season) 05/30/2023    Past Medical History:  Diagnosis Date   Anemia    Anxiety    Arthritis    Asthma    Depression    Depression    H. 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 No pertinent surgical history.  Family History  Problem Relation Age of Onset   Hypertension Mother    Cancer Mother        lung   Diabetes Father    Heart disease Father    Lupus Father    Heart disease Brother    Cancer Brother        sarcoma   Breast cancer Neg Hx    Esophageal cancer Neg Hx    Colon cancer Neg Hx     Social History   Socioeconomic History   Marital status: Married    Spouse name: Not on file   Number of children: 1   Years of education: Not on file   Highest education level: Not on file  Occupational History   Occupation: engineer, agricultural  Tobacco Use   Smoking status: Never   Smokeless tobacco: Never  Vaping Use   Vaping status: Never Used  Substance and Sexual Activity   Alcohol use: Yes    Comment: socially   Drug use: No   Sexual activity: Not Currently    Birth control/protection: Post-menopausal    Comment: 1st intercourse- 23, partners- greater than 5  Other Topics Concern  Not on file  Social History Narrative   Not on file   Social Drivers of Health   Financial Resource Strain: Not on file  Food Insecurity: No Food Insecurity (10/18/2020)   Received from [facility], [facility]   Hunger Vital Sign    Worried About Running Out of Food in the Last Year: Never true    Ran Out of Food in the Last Year: Never true  Transportation Needs: Not on file  Physical Activity: Not on file  Stress: Not on file  Social Connections: Unknown (02/10/2022)   Received from [facility], [facility]   Social Network    Social Network: Not on file  Intimate Partner Violence: Unknown (01/02/2022)   Received from [facility], [facility]   HITS    Physically Hurt: Not on file    Insult or Talk Down To: Not on file    Threaten Physical Harm: Not on file    Scream or Curse: Not on file    No outpatient  medications prior to visit.   No facility-administered medications prior to visit.    Allergies  Allergen Reactions   Penicillins Swelling   Sulfa Antibiotics Itching   Latex Itching and Rash    Rash and itching  At times.    Macrobid  [Nitrofurantoin  Monohyd Macro] Rash   Other Rash    Review of Systems  Constitutional:  Positive for fever. Negative for chills and malaise/fatigue.  HENT:  Positive for congestion and ear pain. Negative for sore throat.   Eyes:  Positive for discharge. Negative for blurred vision, double vision, photophobia and pain.  Respiratory:  Negative for cough and shortness of breath.   Cardiovascular:  Negative for chest pain, palpitations and leg swelling.  Gastrointestinal:  Positive for abdominal pain and diarrhea. Negative for constipation, nausea and vomiting.  Genitourinary:  Negative for dysuria, frequency and urgency.  Musculoskeletal:  Negative for myalgias.  Neurological:  Negative for dizziness and focal weakness.       Objective:    Physical Exam Constitutional:      General: She is not in acute distress.    Appearance: She is not ill-appearing.  HENT:     Right Ear: Tympanic membrane, ear canal and external ear normal.     Left Ear: Tympanic membrane, ear canal and external ear normal.     Nose: Congestion present.     Mouth/Throat:     Mouth: Mucous membranes are moist.     Pharynx: Oropharynx is clear.  Eyes:     Extraocular Movements: Extraocular movements intact.     Conjunctiva/sclera: Conjunctivae normal.  Cardiovascular:     Rate and Rhythm: Normal rate and regular rhythm.     Heart sounds: Normal heart sounds.  Pulmonary:     Effort: Pulmonary effort is normal.     Breath sounds: Normal breath sounds.  Abdominal:     General: Bowel sounds are increased. There is no distension.     Palpations: Abdomen is soft.     Tenderness: There is abdominal tenderness. There is no guarding or rebound. Negative signs include Murphy's  sign and McBurney's sign.  Musculoskeletal:     Cervical back: Normal range of motion and neck supple. No tenderness.  Lymphadenopathy:     Cervical: No cervical adenopathy.  Skin:    General: Skin is warm and dry.     Coloration: Skin is not pale.     Findings: No rash.  Neurological:     General: No  focal deficit present.     Mental Status: She is alert and oriented to person, place, and time.     Motor: No weakness.     Coordination: Coordination normal.     Gait: Gait normal.  Psychiatric:        Mood and Affect: Mood normal.        Behavior: Behavior normal.        Thought Content: Thought content normal.      BP 92/68 (BP Location: Left Arm, Patient Position: Sitting)   Pulse 70   Temp 98 F (36.7 C) (Oral)   Ht 5' 1 (1.549 m)   Wt 124 lb (56.2 kg)   LMP 10/03/2017   SpO2 98%   BMI 23.43 kg/m  Wt Readings from Last 3 Encounters:  09/28/23 124 lb (56.2 kg)  04/29/23 126 lb (57.2 kg)  03/18/23 123 lb (55.8 kg)       Assessment & Plan:   Problem List Items Addressed This Visit   None Visit Diagnoses       Fever, unspecified fever cause    -  Primary   Relevant Orders   POC COVID-19 BinaxNow (Completed)   CBC with Differential/Platelet (Completed)   Comprehensive metabolic panel (Completed)     Acute nonintractable headache, unspecified headache type       Relevant Orders   POC COVID-19 BinaxNow (Completed)     Complaint of nasal congestion       Relevant Orders   POC COVID-19 BinaxNow (Completed)     Diarrhea, unspecified type       Relevant Orders   POC COVID-19 BinaxNow (Completed)   CBC with Differential/Platelet (Completed)   Comprehensive metabolic panel (Completed)     Abdominal cramping       Relevant Orders   Lipase (Completed)     Generalized abdominal tenderness without rebound tenderness       Relevant Orders   Lipase (Completed)      Negative Covid test.  Suspect viral etiology. Diarrhea improving. Afebrile today.  Start  probiotic. Stay hydrated.  Follow up if worsening or not improving in the next 1-2 weeks.  CBC, CMP, lipase ordered.  If diarrhea returns, order stool studies due to recent travel.   Maram Zurn Mili does not currently have medications on file.  No orders of the defined types were placed in this encounter.

## 2023-11-24 ENCOUNTER — Ambulatory Visit: Payer: Self-pay | Admitting: Emergency Medicine

## 2023-11-24 NOTE — Telephone Encounter (Signed)
  Chief Complaint: lower R back pain Symptoms: lower R back pain- 8/10- for 3 days- not improving Frequency: 3 days Pertinent Negatives: Patient denies other symptoms- seemed to start with urinary frequency- but that got better- not sure what is causing the pain now Disposition: [] ED /[] Urgent Care (no appt availability in office) / [x] Appointment(In office/virtual)/ []  [facility]/ [] Home Care/ [] Refused Recommended Disposition /[] [facility]/ []  Follow-up with PCP Additional Notes: Patient declines appointment out of primary office- she has been schedule next day- advised UC if she gets worse before that appointment. Will continue care advised- tylenol/Ibuprofen, heat   Copied from CRM [id number]. Topic: Clinical - Red Word Triage >> Nov 24, 2023 12:07 PM Gurney Maxin H wrote: [facility] that prompted transfer to Nurse Triage: Pain in lower right side towards the back close to her kidneys, if she sits down she has a hard time getting up. Pain is an 8 Reason for Disposition  [1] SEVERE back pain (e.g., excruciating, unable to do any normal activities) AND [2] not improved 2 hours after pain medicine  Answer Assessment - Initial Assessment Questions 1. ONSET: "When did the pain begin?"      Monday 2. LOCATION: "Where does it hurt?" (upper, mid or lower back)     R side- close to kidney 3. SEVERITY: "How bad is the pain?"  (e.g., Scale 1-10; mild, moderate, or severe)   - MILD (1-3): Doesn't interfere with normal activities.    - MODERATE (4-7): Interferes with normal activities or awakens from sleep.    - SEVERE (8-10): Excruciating pain, unable to do any normal activities.      8/10 4. PATTERN: "Is the pain constant?" (e.g., yes, no; constant, intermittent)      Comes and goes 5. RADIATION: "Does the pain shoot into your legs or somewhere else?"     No radiation 6. CAUSE:  "What do you think is causing the back pain?"      movement 7. BACK OVERUSE:  "Any recent lifting of  heavy objects, strenuous work or exercise?"     unknown 8. MEDICINES: "What have you taken so far for the pain?" (e.g., nothing, acetaminophen, NSAIDS)     Hot water,stretching, tylenol on Monday, ibuprofen today 9. NEUROLOGIC SYMPTOMS: "Do you have any weakness, numbness, or problems with bowel/bladder control?"     no 10. OTHER SYMPTOMS: "Do you have any other symptoms?" (e.g., fever, abdomen pain, burning with urination, blood in urine)       Patient was not feeling well over the weekend- frequency  Protocols used: Back Pain-A-AH

## 2023-11-25 ENCOUNTER — Ambulatory Visit (INDEPENDENT_AMBULATORY_CARE_PROVIDER_SITE_OTHER): Payer: Managed Care, Other (non HMO)

## 2023-11-25 ENCOUNTER — Encounter: Payer: Self-pay | Admitting: Internal Medicine

## 2023-11-25 ENCOUNTER — Ambulatory Visit: Payer: Managed Care, Other (non HMO) | Admitting: Internal Medicine

## 2023-11-25 VITALS — BP 120/72 | HR 56 | Temp 98.8°F | Ht 61.0 in | Wt 125.0 lb

## 2023-11-25 DIAGNOSIS — M545 Low back pain, unspecified: Secondary | ICD-10-CM

## 2023-11-25 DIAGNOSIS — E7800 Pure hypercholesterolemia, unspecified: Secondary | ICD-10-CM | POA: Diagnosis not present

## 2023-11-25 DIAGNOSIS — R300 Dysuria: Secondary | ICD-10-CM

## 2023-11-25 DIAGNOSIS — Z8639 Personal history of other endocrine, nutritional and metabolic disease: Secondary | ICD-10-CM

## 2023-11-25 DIAGNOSIS — S39012A Strain of muscle, fascia and tendon of lower back, initial encounter: Secondary | ICD-10-CM

## 2023-11-25 MED ORDER — TRAMADOL HCL 50 MG PO TABS: 50.0000 mg | ORAL_TABLET | Freq: Four times a day (QID) | ORAL | 0 refills | Status: AC | PRN

## 2023-11-25 MED ORDER — CYCLOBENZAPRINE HCL 5 MG PO TABS: 5.0000 mg | ORAL_TABLET | Freq: Three times a day (TID) | ORAL | 1 refills | Status: DC | PRN

## 2023-11-25 NOTE — Progress Notes (Signed)
 Patient ID: Heather Navarro, female   DOB: Mar 31, 1971, 53 y.o.   MRN: [id number]       Chief Complaint: follow up right lower back pain x 3 days       HPI:  Heather Navarro is a 53 y.o. female here with c/o vacuuming one days, then next days with onset right lower back pain mod to severe, constant without radicular symptoms; has happened several times before but much less severe and only lasted a few hours.  Pt denies chest pain, increased sob or doe, wheezing, orthopnea, PND, increased LE swelling, palpitations, dizziness or syncope.   Pt denies polydipsia, polyuria, or new focal neuro s/s.    Pt denies fever, wt loss, night sweats, loss of appetite, or other constitutional symptoms  Denies urinary symptoms such as dysuria, frequency, urgency, flank pain, hematuria or n/v, fever, chills.  Denies worsening reflux, abd pain, dysphagia, n/v, bowel change or blood.  Bending and twisting make it worse.         Wt Readings from Last 3 Encounters:  11/25/23 125 lb (56.7 kg)  09/28/23 124 lb (56.2 kg)  04/29/23 126 lb (57.2 kg)   BP Readings from Last 3 Encounters:  11/25/23 120/72  09/28/23 92/68  04/29/23 128/74         Past Medical History:  Diagnosis Date   Anemia    Anxiety    Arthritis    Asthma    Depression    Depression    H. 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 No pertinent surgical history.  reports that she has never smoked. She has never used smokeless tobacco. She reports current alcohol use. She reports that she does not use drugs. family history includes Cancer in her brother and mother; Diabetes in her father; Heart disease in her brother and father; Hypertension in her mother; Lupus in her father. Allergies  Allergen Reactions   Penicillins Swelling   Sulfa Antibiotics Itching   Latex Itching and Rash    Rash and itching  At times.    Macrobid  [Nitrofurantoin Monohyd Macro] Rash   Other Rash   No current outpatient medications on file prior to visit.   No current facility-administered medications on file prior to visit.        ROS:  All others reviewed and negative.  Objective        PE:  BP 120/72 (BP Location: Right Arm, Patient Position: Sitting, Cuff Size: Normal)   Pulse (!) 56   Temp 98.8 F (37.1 C) (Oral)   Ht 5\' 1"  (1.549 m)   Wt 125 lb (56.7 kg)   LMP 10/03/2017   SpO2 98%   BMI 23.62 kg/m                 Constitutional: Pt appears in NAD               HENT: Head: NCAT.                Right Ear: External ear normal.                 Left Ear: External ear normal.                Eyes: . Pupils are equal, round, and reactive to light. Conjunctivae and EOM are normal  Nose: without d/c or deformity               Neck: Neck supple. Gross normal ROM               Cardiovascular: Normal rate and regular rhythm.                 Pulmonary/Chest: Effort normal and breath sounds without rales or wheezing.                Abd:  Soft, NT, ND, + BS, no organomegaly; has right lumbar paravertebral spasm tender               Neurological: Pt is alert. At baseline orientation, motor grossly intact               Skin: Skin is warm. No rashes, no other new lesions, LE edema - none               Psychiatric: Pt behavior is normal without agitation   Micro: none  Cardiac tracings I have personally interpreted today:  none  Pertinent Radiological findings (summarize): none   Lab Results  Component Value Date   WBC 5.1 09/28/2023   HGB 11.8 (L) 09/28/2023   HCT 36.9 09/28/2023   PLT 263.0 09/28/2023   GLUCOSE 95 09/28/2023   CHOL 280 (H) 12/02/2022   TRIG 113.0 12/02/2022   HDL 66.10 12/02/2022   LDLCALC 191 (H) 12/02/2022   ALT 28 09/28/2023   AST 20 09/28/2023   NA 139 09/28/2023   K 4.0 09/28/2023   CL 105 09/28/2023   CREATININE 0.58 09/28/2023   BUN 10 09/28/2023   CO2 27 09/28/2023   TSH  2.71 12/02/2022   INR 1.1 (H) 03/18/2023   HGBA1C 6.2 12/02/2022   Assessment/Plan:  Heather Navarro is a 53 y.o. Other or two or more races [6] female with  has a past medical history of Anemia, Anxiety, Arthritis, Asthma, Depression, Depression, H. pylori infection (11/26/2008), High cholesterol, History of kidney stones (11/26/2005), and HSV-2 (herpes simplex virus 2) infection (01/27/2007).  Lumbar strain, initial encounter C/w right lumbar msk strain, for ua and cx, lumbara xray, tramadol prn and flexeril 5 tid prn,  to f/u any worsening symptoms or concerns  H/O vitamin D deficiency Last vitamin D Lab Results  Component Value Date   VD25OH 18.40 (L) 01/06/2022   Low, reminded to start oral replacement   Hypercholesterolemia Lab Results  Component Value Date   LDLCALC 191 (H) 12/02/2022   Uncontrolled, pt for lower chol diet, declines statin and card CT score for now, for f/u lab with pcp soon  Followup: Return if symptoms worsen or fail to improve.  Oliver Barre, MD 11/28/2023 1:22 PM [facility]

## 2023-11-25 NOTE — Patient Instructions (Signed)
 Please take all new medication as prescribed - the pain medication, and muscle relaxer  Please continue all other medications as before, and refills have been done if requested.  Please have the pharmacy call with any other refills you may need.  Please keep your appointments with your specialists as you may have planned  Please go to the XRAY Department in the first floor for the x-ray testing  Please go to the LAB at the blood drawing area for the tests to be done - just the urine testing today  You will be contacted by phone if any changes need to be made immediately.  Otherwise, you will receive a letter about your results with an explanation, but please check with MyChart first.

## 2023-11-26 ENCOUNTER — Encounter: Payer: Self-pay | Admitting: Internal Medicine

## 2023-11-26 LAB — URINE CULTURE

## 2023-11-26 LAB — URINALYSIS, ROUTINE W REFLEX MICROSCOPIC
Bilirubin Urine: NEGATIVE
Hgb urine dipstick: NEGATIVE
Ketones, ur: NEGATIVE
Leukocytes,Ua: NEGATIVE
Nitrite: NEGATIVE
Specific Gravity, Urine: 1.01 (ref 1.000–1.030)
Total Protein, Urine: NEGATIVE
Urine Glucose: NEGATIVE
Urobilinogen, UA: 0.2 (ref 0.0–1.0)
WBC, UA: NONE SEEN — AB (ref 0–?)
pH: 7 (ref 5.0–8.0)

## 2023-11-28 ENCOUNTER — Encounter: Payer: Self-pay | Admitting: Internal Medicine

## 2023-11-28 NOTE — Assessment & Plan Note (Signed)
 C/w right lumbar msk strain, for ua and cx, lumbara xray, tramadol prn and flexeril 5 tid prn,  to f/u any worsening symptoms or concerns

## 2023-11-28 NOTE — Assessment & Plan Note (Signed)
 Last vitamin D Lab Results  Component Value Date   VD25OH 18.40 (L) 01/06/2022   Low, reminded to start oral replacement

## 2023-11-28 NOTE — Assessment & Plan Note (Signed)
 Lab Results  Component Value Date   LDLCALC 191 (H) 12/02/2022   Uncontrolled, pt for lower chol diet, declines statin and card CT score for now, for f/u lab with pcp soon

## 2023-12-16 ENCOUNTER — Encounter: Payer: Self-pay | Admitting: Internal Medicine

## 2024-01-10 ENCOUNTER — Ambulatory Visit: Payer: Self-pay

## 2024-01-10 NOTE — Telephone Encounter (Signed)
 Copied from CRM [id number]. Topic: Clinical - Red Word Triage >> Jan 10, 2024 10:41 AM Heather Navarro wrote: Red Word that prompted transfer to Nurse Triage: Patient noticed blood in the stool on Saturday morning and been experiencing pain on the right side of her body.   Chief Complaint: Blood in stool x 1 this weekend. Symptoms: Bright red Frequency: Weekend  Pertinent Negatives: Patient denies  Disposition: [] ED /[] Urgent Care (no appt availability in office) / [x] Appointment(In office/virtual)/ []  [facility]/ [] Home Care/ [] Refused Recommended Disposition /[] [place] [facility]/ []  Follow-up with PCP Additional Notes: Agrees with appointment.  Reason for Disposition  MILD rectal bleeding (more than just a few drops or streaks)  Answer Assessment - Initial Assessment Questions 1. APPEARANCE of BLOOD: "What color is it?" "Is it passed separately, on the surface of the stool, or mixed in with the stool?"      Was bright red 2. AMOUNT: "How much blood was passed?"      More when she wiped 3. FREQUENCY: "How many times has blood been passed with the stools?"      X 1 4. ONSET: "When was the blood first seen in the stools?" (Days or weeks)      Saturday 5. DIARRHEA: "Is there also some diarrhea?" If Yes, ask: "How many diarrhea stools in the past 24 hours?"      No 6. CONSTIPATION: "Do you have constipation?" If Yes, ask: "How bad is it?"     No 7. RECURRENT SYMPTOMS: "Have you had blood in your stools before?" If Yes, ask: "When was the last time?" and "What happened that time?"      No 8. BLOOD THINNERS: "Do you take any blood thinners?" (e.g., Coumadin/warfarin, Pradaxa/dabigatran, aspirin)     No 9. OTHER SYMPTOMS: "Do you have any other symptoms?"  (e.g., abdomen pain, vomiting, dizziness, fever)     No 10. PREGNANCY: "Is there any chance you are pregnant?" "When was your last menstrual period?"       No  Protocols used: Rectal Bleeding-A-AH

## 2024-01-12 ENCOUNTER — Ambulatory Visit: Admitting: Emergency Medicine

## 2024-01-12 ENCOUNTER — Encounter: Payer: Self-pay | Admitting: Emergency Medicine

## 2024-01-12 VITALS — BP 106/64 | HR 60 | Temp 98.6°F | Resp 16 | Ht 61.0 in | Wt 127.0 lb

## 2024-01-12 DIAGNOSIS — K602 Anal fissure, unspecified: Secondary | ICD-10-CM | POA: Diagnosis not present

## 2024-01-12 DIAGNOSIS — K625 Hemorrhage of anus and rectum: Secondary | ICD-10-CM | POA: Insufficient documentation

## 2024-01-12 MED ORDER — HYDROCORTISONE (PERIANAL) 2.5 % EX CREA
1.0000 | TOPICAL_CREAM | Freq: Two times a day (BID) | CUTANEOUS | 0 refills | Status: AC
Start: 2024-01-12 — End: ?

## 2024-01-12 MED ORDER — VALACYCLOVIR HCL 500 MG PO TABS: 500.0000 mg | ORAL_TABLET | Freq: Two times a day (BID) | ORAL | 0 refills | Status: AC

## 2024-01-12 NOTE — Progress Notes (Signed)
 Heather Navarro 53 y.o.   Chief Complaint  Patient presents with   Rectal Bleeding    Patient states possible hemorrhoids     HISTORY OF PRESENT ILLNESS: This is a 53 y.o. female A1A complaining of rectal bleeding that happened last Friday, 5 days ago.  Followed by massive diarrhea Saturday. Normal bowel movement yesterday but she noticed small amount of blood in tissue paper Colonoscopy from 2023 was normal.  No internal hemorrhoids or diverticulosis. Painful perianal area. No other complaints or medical concerns today.  Rectal Bleeding  Pertinent negatives include no fever, no abdominal pain, no diarrhea, no nausea, no vomiting, no chest pain, no headaches, no coughing and no rash.    Prior to Admission medications   Medication Sig Start Date End Date Taking? Authorizing Provider  cyclobenzaprine (FLEXERIL) 5 MG tablet Take 1 tablet (5 mg total) by mouth 3 (three) times daily as needed for muscle spasms. 11/25/23  Yes Roslyn Coombe, MD  traMADol (ULTRAM) 50 MG tablet Take 1 tablet (50 mg total) by mouth every 6 (six) hours as needed. 11/25/23  Yes Roslyn Coombe, MD    Allergies  Allergen Reactions   Penicillins Swelling   Sulfa Antibiotics Itching   Latex Itching and Rash    Rash and itching  At times.    Macrobid [Nitrofurantoin Monohyd Macro] Rash   Other Rash    Patient Active Problem List   Diagnosis Date Noted   Lumbar strain, initial encounter 12/02/2022   Esophageal dysphagia 09/16/2022   Situational mixed anxiety and depressive disorder 08/25/2022   Recurrent genital herpes 05/21/2022   Dyspepsia 05/21/2022   Stress incontinence 02/17/2022   Lumbar pain 02/17/2022   Chronic neck pain 02/17/2022   History of coma 10/02/2020   History of ELISA positive for HSV 10/30/2014   Adjustment disorder with depressed mood 10/30/2014   H/O vitamin D deficiency 04/07/2012   History of anemia 04/07/2012   Hypercholesterolemia 04/07/2012    Past Medical History:   Diagnosis Date   Anemia    Anxiety    Arthritis    Asthma    Depression    Depression    H. 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  Social History   Socioeconomic History   Marital status: Married    Spouse name: Not on file   Number of children: 1   Years of education: Not on file   Highest education level: Not on file  Occupational History   Occupation: Engineer, agricultural  Tobacco Use   Smoking status: Never   Smokeless tobacco: Never  Vaping Use   Vaping status: Never Used  Substance and Sexual Activity   Alcohol use: Yes    Comment: socially   Drug use: No   Sexual activity: Not Currently    Birth control/protection: Post-menopausal    Comment: 1st intercourse- 23, partners- greater than 5  Other Topics Concern   Not on file  Social History Narrative   Not on file   Social Drivers of Health   Financial Resource Strain: Not on file  Food Insecurity: No Food Insecurity (10/18/2020)   Received from [facility]   Hunger Vital Sign    Worried About Running Out of Food in the Last Year: Never true    Ran Out of Food in the Last Year: Never true  Transportation Needs: Not on file  Physical Activity: Not on file  Stress: Not on file  Social Connections: Unknown (02/10/2022)   Received from [facility]   Social Network    Social Network: Not on file  Intimate Partner Violence: Unknown (01/02/2022)   Received from [facility]   HITS    Physically Hurt: Not on file    Insult or Talk Down To: Not on file    Threaten Physical Harm: Not on file    Scream or Curse: Not on file    Family History  Problem Relation Age of Onset   Hypertension Mother    Cancer Mother        lung   Diabetes Father    Heart disease Father    Lupus Father    Heart disease Brother    Cancer  Brother        sarcoma   Breast cancer Neg Hx    Esophageal cancer Neg Hx    Colon cancer Neg Hx      Review of Systems  Constitutional: Negative.  Negative for chills and fever.  HENT: Negative.  Negative for congestion and sore throat.   Respiratory: Negative.  Negative for cough and shortness of breath.   Cardiovascular: Negative.  Negative for chest pain and palpitations.  Gastrointestinal:  Positive for blood in stool and hematochezia. Negative for abdominal pain, diarrhea, nausea and vomiting.  Genitourinary: Negative.  Negative for dysuria.  Musculoskeletal: Negative.   Skin: Negative.  Negative for rash.  Neurological: Negative.  Negative for dizziness and headaches.  All other systems reviewed and are negative.  Vitals:   01/12/24 0928  BP: 106/64  Pulse: 60  Resp: 16  Temp: 98.6 F (37 C)  SpO2: 100%    Physical Exam Vitals reviewed.  Constitutional:      Appearance: Normal appearance.  HENT:     Head: Normocephalic.  Eyes:     Extraocular Movements: Extraocular movements intact.  Cardiovascular:     Rate and Rhythm: Normal rate.  Pulmonary:     Effort: Pulmonary effort is normal.  Abdominal:     Palpations: Abdomen is soft.     Tenderness: There is no abdominal tenderness.  Genitourinary:    Rectum: Tenderness and anal fissure present. No external hemorrhoid.  Skin:    General: Skin is warm and dry.  Neurological:     Mental Status: She is alert and oriented to person, place, and time.  Psychiatric:        Mood and Affect: Mood normal.        Behavior: Behavior normal.    ASSESSMENT & PLAN: A total of 43 minutes was spent with the patient and counseling/coordination of care regarding preparing for this visit, review of most recent office visit notes, review of multiple chronic medical conditions and their management, differential diagnosis of rectal bleeding and management of perianal fissure, need for GI evaluation, pain management, review of all  medications, review of most recent bloodwork results, review of health maintenance items, education on nutrition, prognosis, documentation, and need for follow up.   Problem List Items Addressed This Visit       Digestive   Anal fissure - Primary   Tender to palpation Pain management discussed May benefit from Anusol [facility] cream Needs evaluation by GI or colorectal surgeon Referral placed today      Relevant Medications   hydrocortisone (ANUSOL-HC) 2.5 % rectal cream   Other Relevant Orders  Ambulatory referral to Gastroenterology   Rectal bleeding   Clinically stable.  No red flag signs or symptoms Normal colonoscopy from 2023 Diet and nutrition discussed Unknown trigger Needs GI evaluation Referral placed today      Relevant Orders   Ambulatory referral to Gastroenterology   Patient Instructions  Fisura anal en los adultos Anal Fissure, Adult  Una fisura anal es un pequeo desgarro o un corte en el tejido que est cerca del orificio entre las nalgas (ano). En la [employer], el sangrado proveniente del desgarro o el corte se detiene por s solo despus de algunos minutos. Puede ocurrir que sangre cada vez que defeque hasta que el desgarro o la fisura se cure. Cules son las causas? Hacer materia fecal (heces) grande o dura. Tener dificultad para defecar (estreimiento). Hacer materia fecal lquida (diarrea). Una enfermedad inflamatoria del intestino, como la enfermedad de Crohn o la colitis ulcerosa. Parto. Infecciones. Sexo anal. Cules son los signos o sntomas? Sangrado proveniente del ano. Pequeas cantidades de [employer] . La sangre recubre el exterior de las deposiciones. No est mezclada con las deposiciones. Pequeas cantidades de sangre en el papel higinico o en el inodoro despus de defecar. Dolor al defecar. Picazn o irritacin alrededor del ano. Cmo se trata? El tratamiento puede incluir: Hacer cambios en la dieta. Esto  puede ayudar si tiene dificultad para defecar. Tomar suplementos de fibra. Estos pueden ayudar a que la materia fecal sea blanda. Tomar baos de agua caliente (baos de asiento). Estos pueden ayudar a Programmer, applications. Utilizar cremas y ungentos. Si otros tratamientos no funcionan, tal vez necesite: Una inyeccin cerca del desgarro o corte (inyeccin de toxina botulnica). Ciruga para arreglar el desgarro o corte. Siga estas instrucciones en su casa: Medicamentos [employer] de venta libre y los recetados solamente como se lo haya indicado el mdico. Esto incluye cremas y ungentos que contienen medicamentos. Use medicamentos para ablandar la materia fecal como se lo haya indicado el mdico. Tratamiento del estreimiento Es posible que deba tomar medidas para prevenir o tratar los problemas para defecar: Product manager suficiente lquido para Radio producer pis (orina) de color amarillo plido. Coma alimentos ricos en fibra. Entre ellos, frijoles, cereales integrales y frutas y verduras frescas. No consumir bananas que no estn maduras. Las bananas maduras son una buena eleccin. Limitar los alimentos con alto contenido de grasa y International aid/development worker. Estos incluyen alimentos fritos o dulces. Evitar los productos lcteos. Esto incluye la [place].  Instrucciones generales  Mantenga la zona del ano limpia y seca. Dese un bao de agua tibia como se lo haya indicado el mdico. No use jabn para limpiar la zona afectada. Comunquese con un mdico si: Aumenta el sangrado. Tiene fiebre. Tiene heces acuosas mezcladas con sangre. El dolor persiste. Los [employer]. Esta informacin no tiene Theme park manager el consejo del mdico. Asegrese de hacerle al mdico cualquier pregunta que tenga. Document Revised: 10/24/2022 Document Reviewed: 10/24/2022 Elsevier Patient Education  2024 Elsevier Inc.   Maryagnes Small, MD [facility]

## 2024-01-12 NOTE — Patient Instructions (Signed)
 Fisura anal en los adultos Anal Fissure, Adult  Una fisura anal es un pequeo desgarro o un corte en el tejido que est cerca del orificio entre las nalgas (ano). En la [employer], el sangrado proveniente del desgarro o el corte se detiene por s solo despus de algunos minutos. Puede ocurrir que sangre cada vez que defeque hasta que el desgarro o la fisura se cure. Cules son las causas? Hacer materia fecal (heces) grande o dura. Tener dificultad para defecar (estreimiento). Hacer materia fecal lquida (diarrea). Una enfermedad inflamatoria del intestino, como la enfermedad de Crohn o la colitis ulcerosa. Parto. Infecciones. Sexo anal. Cules son los signos o sntomas? Sangrado proveniente del ano. Pequeas cantidades de [employer] . La sangre recubre el exterior de las deposiciones. No est mezclada con las deposiciones. Pequeas cantidades de sangre en el papel higinico o en el inodoro despus de defecar. Dolor al defecar. Picazn o irritacin alrededor del ano. Cmo se trata? El tratamiento puede incluir: Hacer cambios en la dieta. Esto puede ayudar si tiene dificultad para defecar. Tomar suplementos de fibra. Estos pueden ayudar a que la materia fecal sea blanda. Tomar baos de agua caliente (baos de asiento). Estos pueden ayudar a Programmer, applications. Utilizar cremas y ungentos. Si otros tratamientos no funcionan, tal vez necesite: Una inyeccin cerca del desgarro o corte (inyeccin de toxina botulnica). Ciruga para arreglar el desgarro o corte. Siga estas instrucciones en su casa: Medicamentos [employer] de venta libre y los recetados solamente como se lo haya indicado el mdico. Esto incluye cremas y ungentos que contienen medicamentos. Use medicamentos para ablandar la materia fecal como se lo haya indicado el mdico. Tratamiento del estreimiento Es posible que deba tomar medidas para prevenir o tratar los problemas para  defecar: Product manager suficiente lquido para Radio producer pis (orina) de color amarillo plido. Coma alimentos ricos en fibra. Entre ellos, frijoles, cereales integrales y frutas y verduras frescas. No consumir bananas que no estn maduras. Las bananas maduras son una buena eleccin. Limitar los alimentos con alto contenido de grasa y International aid/development worker. Estos incluyen alimentos fritos o dulces. Evitar los productos lcteos. Esto incluye la [place].  Instrucciones generales  Mantenga la zona del ano limpia y seca. Dese un bao de agua tibia como se lo haya indicado el mdico. No use jabn para limpiar la zona afectada. Comunquese con un mdico si: Aumenta el sangrado. Tiene fiebre. Tiene heces acuosas mezcladas con sangre. El dolor persiste. Los [employer]. Esta informacin no tiene Theme park manager el consejo del mdico. Asegrese de hacerle al mdico cualquier pregunta que tenga. Document Revised: 10/24/2022 Document Reviewed: 10/24/2022 Elsevier Patient Education  2024 [employer].

## 2024-01-12 NOTE — Assessment & Plan Note (Signed)
 Clinically stable.  No red flag signs or symptoms Normal colonoscopy from 2023 Diet and nutrition discussed Unknown trigger Needs GI evaluation Referral placed today

## 2024-01-12 NOTE — Assessment & Plan Note (Signed)
 Tender to palpation Pain management discussed May benefit from Anusol HC cream Needs evaluation by GI or colorectal surgeon Referral placed today

## 2024-02-10 ENCOUNTER — Encounter: Payer: Self-pay | Admitting: Emergency Medicine

## 2024-03-20 ENCOUNTER — Ambulatory Visit: Payer: Managed Care, Other (non HMO) | Admitting: Nurse Practitioner

## 2024-05-26 ENCOUNTER — Ambulatory Visit: Admitting: Family Medicine

## 2024-07-19 ENCOUNTER — Ambulatory Visit: Payer: Self-pay

## 2024-07-19 NOTE — Telephone Encounter (Signed)
 FYI Only or Action Required?: FYI only for provider.  Patient was last seen in primary care on 01/12/2024 by Purcell Emil Schanz, MD.  Called Nurse Triage reporting Back Pain.  Symptoms began several days ago.  Interventions attempted: OTC medications: tylenol, ibuprofen .  Symptoms are: gradually worsening.  Triage Disposition: See HCP Within 4 Hours (Or PCP Triage)  Patient/caregiver understands and will follow disposition?: Yes  Copied from CRM #[id number]. Topic: Clinical - Red Word Triage >> Jul 19, 2024 12:53 PM Suzen RAMAN wrote: Red Word that prompted transfer to Nurse Triage: sore throat, coughing, sneezing, right side pain, requesting an appt . Reason for Disposition  [1] SEVERE back pain (e.g., excruciating, unable to do any normal activities) AND [2] not improved 2 hours after pain medicine  Answer Assessment - Initial Assessment Questions Returned from [place] visiting family on the 10/17. Saw MD down there for UTI sx and they said she had liver issues and prescribed her medicine. Unable to remember what it was. She was also dehydrated with no UTI. Increased hydration with improvement.   Right back pain started 2 days ago was light and now is getting worse. Tylenol, advil. Sharp pain under her ribs yesterday. Radiating down her right leg. Denies numb and tingling, or weakness.Pt endorses causing her to limp with the pain. Mild to mod relief with tylenol, ibuprofen but doesn't want to keep taking to much of it   Arthritis in her back- but unsure if related to that. Doesn't want to have to take medication daily.   1. ONSET: When did the pain begin? (e.g., minutes, hours, days)     Monday  2. LOCATION: Where does it hurt? (upper, mid or lower back)     Right back into the side under her ribs or muscle  3. SEVERITY: How bad is the pain?  (e.g., Scale 1-10; mild, moderate, or severe)     8/10 4. PATTERN: Is the pain constant? (e.g., yes, no; constant, intermittent)       Lessened with medication but comes back  5. RADIATION: Does the pain shoot into your legs or somewhere else?     Down right leg  6. CAUSE:  What do you think is causing the back pain?      Arthritis in back  7. BACK OVERUSE:  Any recent lifting of heavy objects, strenuous work or exercise?     Up and down at work  8. MEDICINES: What have you taken so far for the pain? (e.g., nothing, acetaminophen, NSAIDS)     Tylneol and ibuprofen  9. NEUROLOGIC SYMPTOMS: Do you have any weakness, numbness, or problems with bowel/bladder control?     Denies  10. OTHER SYMPTOMS: Do you have any other symptoms? (e.g., fever, abdomen pain, burning with urination, blood in urine)       Denies  Answer Assessment - Initial Assessment Questions Cough, sneezing, causing bloody noses. Started 2 days after returning from trip to visit dad with [place] in [place].   Advised humifier, steam shower, nasal saline wash. ED?UC/Callback precautions given.   1. ONSET: When did the cough begin?      Sunday  2. SEVERITY: How bad is the cough today?      Coughing fits but not continuous. Sneezing a lot as well 3. SPUTUM: Describe the color of your sputum (e.g., none, dry cough; clear, white, yellow, green)     Sometimes green  4. HEMOPTYSIS: Are you coughing up any blood? If Yes, ask: How much? (e.g.,  flecks, streaks, tablespoons, etc.)     Denies coughing up blood- positive for nose bleed.  5. DIFFICULTY BREATHING: Are you having difficulty breathing? If Yes, ask: How bad is it? (e.g., mild, moderate, severe)      Having to catch her breath after coughing fit 6. FEVER: Do you have a fever? If Yes, ask: What is your temperature, how was it measured, and when did it start?     Denies  7. CARDIAC HISTORY: Do you have any history of heart disease? (e.g., heart attack, congestive heart failure)      Denies  8. LUNG HISTORY: Do you have any history of lung disease?  (e.g., pulmonary embolus, asthma,  emphysema)     Denies 9. PE RISK FACTORS: Do you have a history of blood clots? (or: recent major surgery, recent prolonged travel, bedridden)     denies 10. OTHER SYMPTOMS: Do you have any other symptoms? (e.g., runny nose, wheezing, chest pain)       Bloody nose 12. TRAVEL: Have you traveled out of the country in the last month? (e.g., travel history, exposures)       Just returned from [place] Sunday  Protocols used: Back Pain-A-AH, Cough - Acute Non-Productive-A-AH

## 2024-07-20 ENCOUNTER — Ambulatory Visit: Admitting: Internal Medicine

## 2024-07-20 ENCOUNTER — Encounter: Payer: Self-pay | Admitting: Internal Medicine

## 2024-07-20 VITALS — BP 122/76 | HR 55 | Temp 98.2°F | Ht 61.0 in | Wt 125.0 lb

## 2024-07-20 DIAGNOSIS — R040 Epistaxis: Secondary | ICD-10-CM | POA: Diagnosis not present

## 2024-07-20 DIAGNOSIS — M545 Low back pain, unspecified: Secondary | ICD-10-CM

## 2024-07-20 DIAGNOSIS — J069 Acute upper respiratory infection, unspecified: Secondary | ICD-10-CM | POA: Diagnosis not present

## 2024-07-20 DIAGNOSIS — E7800 Pure hypercholesterolemia, unspecified: Secondary | ICD-10-CM

## 2024-07-20 DIAGNOSIS — Z8639 Personal history of other endocrine, nutritional and metabolic disease: Secondary | ICD-10-CM | POA: Diagnosis not present

## 2024-07-20 DIAGNOSIS — R739 Hyperglycemia, unspecified: Secondary | ICD-10-CM | POA: Diagnosis not present

## 2024-07-20 MED ORDER — HYDROCODONE BIT-HOMATROP MBR 5-1.5 MG/5ML PO SOLN: 5.0000 mL | Freq: Four times a day (QID) | ORAL | 0 refills | Status: AC | PRN

## 2024-07-20 MED ORDER — CYCLOBENZAPRINE HCL 5 MG PO TABS: 5.0000 mg | ORAL_TABLET | Freq: Three times a day (TID) | ORAL | 1 refills | Status: AC | PRN

## 2024-07-20 MED ORDER — ATORVASTATIN CALCIUM 20 MG PO TABS: 20.0000 mg | ORAL_TABLET | Freq: Every day | ORAL | 3 refills | Status: AC

## 2024-07-20 MED ORDER — AZITHROMYCIN 250 MG PO TABS: ORAL_TABLET | ORAL | 1 refills | Status: AC

## 2024-07-20 NOTE — Patient Instructions (Signed)
 Please take all new medication as prescribed  - the antibiotic, cough medicine, muscle relaxer  Please continue all other medications as before, and refills have been done for the Atorvastatin as well (for cholesterol)  Please have the pharmacy call with any other refills you may need.  Please continue your efforts at being more active, low cholesterol diet, and weight control.  Please keep your appointments with your specialists as you may have planned  You will be contacted regarding the referral for: Physical Therapy,  and ENT   Please go to the LAB at the blood drawing area for the tests to be done in month, so that they are done when you see your PCP and the atorvastatin has time to work  You will be contacted by phone if any changes need to be made immediately.  Otherwise, you will receive a letter about your results with an explanation, but please check with MyChart first.  Please see Dr Sagardia for CPX physical in 1 month or after

## 2024-07-20 NOTE — Progress Notes (Signed)
 Patient ID: Heather Navarro, female   DOB: [birth year], 53 y.o.   MRN: [id number]        Chief Complaint: follow up right lower back pain, sinusitis, right nosebleed, hld       HPI:  Heather Navarro is a 53 y.o. female here with c/o acute onset 1 wk right lower back pain and tender spasm, worse to twist at waist, and makes standing up straight difficult.  Denies worsening reflux, abd pain, dysphagia, n/v, bowel change or blood.  Denies urinary symptoms such as dysuria, frequency, urgency, flank pain, hematuria or n/v,   Here with 2-3 days acute onset fever, facial pain, pressure, headache, general weakness and malaise, and greenish d/c, with mild ST and cough, but pt denies chest pain, wheezing, increased sob or doe, orthopnea, PND, increased LE swelling, palpitations, dizziness or syncope.  Also mentions a recurring nosebleed on the right in hte last 2 months, mild but keeps happening it seems.  Also recently returned after stay in [place]. Where she was given samples of lipitor 20 mg and asked to follow her on her return.  Tolerating well.   Wt Readings from Last 3 Encounters:  07/20/24 125 lb (56.7 kg)  01/12/24 127 lb (57.6 kg)  11/25/23 125 lb (56.7 kg)   BP Readings from Last 3 Encounters:  07/20/24 122/76  01/12/24 106/64  11/25/23 120/72         Past Medical History:  Diagnosis Date   Anemia    Anxiety    Arthritis    Asthma    Depression    Depression    H. 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 No pertinent surgical history.  reports that she has never smoked. She has never used smokeless tobacco. She reports current alcohol use. She reports that she does not use drugs. family history includes Cancer in her brother and mother; Diabetes in her father; Heart disease in her brother and father; Hypertension in her mother; Lupus in her father. Allergies   Allergen Reactions   Penicillins Swelling   Sulfa Antibiotics Itching   Latex Itching and Rash    Rash and itching  At times.    Macrobid  [Nitrofurantoin  Monohyd Macro] Rash   Other Rash   Current Outpatient Medications on File Prior to Visit  Medication Sig Dispense Refill   hydrocortisone  (ANUSOL -HC) 2.5 % rectal cream Place 1 Application rectally 2 (two) times daily. (Patient not taking: Reported on 07/20/2024) 30 g 0   traMADol  (ULTRAM ) 50 MG tablet Take 1 tablet (50 mg total) by mouth every 6 (six) hours as needed. (Patient not taking: Reported on 07/20/2024) 30 tablet 0   No current facility-administered medications on file prior to visit.        ROS:  All others reviewed and negative.  Objective        PE:  BP 122/76 (BP Location: Right Arm, Patient Position: Sitting, Cuff Size: Normal)   Pulse (!) 55   Temp 98.2 F (36.8 C) (Oral)   Ht 5' 1 (1.549 m)   Wt 125 lb (56.7 kg)   LMP 10/03/2017   SpO2 99%   BMI 23.62 kg/m                 Constitutional: Pt appears in NAD               HENT: Head: NCAT.  Right Ear: External ear normal.                 Left Ear: External ear normal. Bilat tm's with mild erythema.  Max sinus areas mild tender.  Pharynx with mild erythema, no exudate               Eyes: . Pupils are equal, round, and reactive to light. Conjunctivae and EOM are normal               Nose: without d/c or deformity               Neck: Neck supple. Gross normal ROM               Cardiovascular: Normal rate and regular rhythm.                 Pulmonary/Chest: Effort normal and breath sounds without rales or wheezing.                Abd:  Soft, NT, ND, + BS, no organomegaly; has tender right lumbar paraspinal spasm                Neurological: Pt is alert. At baseline orientation, motor grossly intact               Skin: Skin is warm. No rashes, no other new lesions, LE edema - none               Psychiatric: Pt behavior is normal without agitation    Micro: none  Cardiac tracings I have personally interpreted today:  none  Pertinent Radiological findings (summarize): none   Lab Results  Component Value Date   WBC 5.7 07/20/2024   HGB 11.0 (L) 07/20/2024   HCT 34.1 (L) 07/20/2024   PLT 274.0 07/20/2024   GLUCOSE 86 07/20/2024   CHOL 209 (H) 07/20/2024   TRIG 92.0 07/20/2024   HDL 61.10 07/20/2024   LDLCALC 130 (H) 07/20/2024   ALT 17 07/20/2024   AST 18 07/20/2024   NA 139 07/20/2024   K 4.0 07/20/2024   CL 105 07/20/2024   CREATININE 0.56 07/20/2024   BUN 12 07/20/2024   CO2 25 07/20/2024   TSH 1.98 07/20/2024   INR 1.1 (H) 03/18/2023   HGBA1C 6.2 07/20/2024   Assessment/Plan:  Heather Navarro is a 53 y.o. Other or two or more races [6] female with  has a past medical history of Anemia, Anxiety, Arthritis, Asthma, Depression, Depression, H. pylori infection (11/26/2008), High cholesterol, History of kidney stones (11/26/2005), and HSV-2 (herpes simplex virus 2) infection (01/27/2007).  Lumbar pain Recent onset mild to mod - for flexeril  5 tid prn, and refer PT for likely msk strain  Hypercholesterolemia Pt taking samples of lipitor 20 mg every day from [place], asks for script and f/u labs prior to her CPX with PCP next month  H/O vitamin D deficiency Last vitamin D Lab Results  Component Value Date   VD25OH 26.94 (L) 07/20/2024   Low, to start oral replacement   Right-sided nosebleed Recurrent, pt has had cautery in past remotely, now for ENT referral for likely similar tx  Acute upper respiratory infection Mild to mod, for antibx course zpack and cough med prn,  to f/u any worsening symptoms or concerns  Followup: No follow-ups on file.  Lynwood Rush, MD 07/22/2024 8:51 PM [facility]

## 2024-07-21 ENCOUNTER — Ambulatory Visit: Payer: Self-pay | Admitting: Internal Medicine

## 2024-07-21 LAB — URINALYSIS, ROUTINE W REFLEX MICROSCOPIC
Bilirubin Urine: NEGATIVE
Hgb urine dipstick: NEGATIVE
Ketones, ur: NEGATIVE
Leukocytes,Ua: NEGATIVE
Nitrite: NEGATIVE
Specific Gravity, Urine: 1.01 (ref 1.000–1.030)
Total Protein, Urine: NEGATIVE
Urine Glucose: NEGATIVE
Urobilinogen, UA: 0.2 (ref 0.0–1.0)
pH: 7 (ref 5.0–8.0)

## 2024-07-21 LAB — BASIC METABOLIC PANEL WITH GFR
BUN: 12 mg/dL (ref 6–23)
CO2: 25 meq/L (ref 19–32)
Calcium: 10.2 mg/dL (ref 8.4–10.5)
Chloride: 105 meq/L (ref 96–112)
Creatinine, Ser: 0.56 mg/dL (ref 0.40–1.20)
GFR: 103.01 mL/min (ref >60.00)
Glucose, Bld: 86 mg/dL (ref 70–99)
Potassium: 4 meq/L (ref 3.5–5.1)
Sodium: 139 meq/L (ref 135–145)

## 2024-07-21 LAB — CBC WITH DIFFERENTIAL/PLATELET
Basophils Absolute: 0 K/uL (ref 0.0–0.1)
Basophils Relative: 0.8 % (ref 0.0–3.0)
Eosinophils Absolute: 0.1 K/uL (ref 0.0–0.7)
Eosinophils Relative: 0.9 % (ref 0.0–5.0)
HCT: 34.1 % — ABNORMAL LOW (ref 36.0–46.0)
Hemoglobin: 11 g/dL — ABNORMAL LOW (ref 12.0–15.0)
Lymphocytes Relative: 23.1 % (ref 12.0–46.0)
Lymphs Abs: 1.3 K/uL (ref 0.7–4.0)
MCHC: 32.2 g/dL (ref 30.0–36.0)
MCV: 83.2 fl (ref 78.0–100.0)
Monocytes Absolute: 0.5 K/uL (ref 0.1–1.0)
Monocytes Relative: 8.6 % (ref 3.0–12.0)
Neutro Abs: 3.8 K/uL (ref 1.4–7.7)
Neutrophils Relative %: 66.6 % (ref 43.0–77.0)
Platelets: 274 K/uL (ref 150.0–400.0)
RBC: 4.1 Mil/uL (ref 3.87–5.11)
RDW: 14.1 % (ref 11.5–15.5)
WBC: 5.7 K/uL (ref 4.0–10.5)

## 2024-07-21 LAB — HEMOGLOBIN A1C: Hgb A1c MFr Bld: 6.2 % (ref 4.6–6.5)

## 2024-07-21 LAB — HEPATIC FUNCTION PANEL
ALT: 17 U/L (ref 0–35)
AST: 18 U/L (ref 0–37)
Albumin: 4.3 g/dL (ref 3.5–5.2)
Alkaline Phosphatase: 103 U/L (ref 39–117)
Bilirubin, Direct: 0.1 mg/dL (ref 0.0–0.3)
Total Bilirubin: 0.4 mg/dL (ref 0.2–1.2)
Total Protein: 7.4 g/dL (ref 6.0–8.3)

## 2024-07-21 LAB — LIPID PANEL
Cholesterol: 209 mg/dL — ABNORMAL HIGH (ref 0–200)
HDL: 61.1 mg/dL (ref >39.00)
LDL Cholesterol: 130 mg/dL — ABNORMAL HIGH (ref 0–99)
NonHDL: 148.1
Total CHOL/HDL Ratio: 3
Triglycerides: 92 mg/dL (ref 0.0–149.0)
VLDL: 18.4 mg/dL (ref 0.0–40.0)

## 2024-07-21 LAB — VITAMIN D 25 HYDROXY (VIT D DEFICIENCY, FRACTURES): VITD: 26.94 ng/mL — ABNORMAL LOW (ref 30.00–100.00)

## 2024-07-21 LAB — TSH: TSH: 1.98 u[IU]/mL (ref 0.35–5.50)

## 2024-07-22 ENCOUNTER — Encounter: Payer: Self-pay | Admitting: Internal Medicine

## 2024-07-22 DIAGNOSIS — R040 Epistaxis: Secondary | ICD-10-CM | POA: Insufficient documentation

## 2024-07-22 DIAGNOSIS — J069 Acute upper respiratory infection, unspecified: Secondary | ICD-10-CM | POA: Insufficient documentation

## 2024-07-22 NOTE — Assessment & Plan Note (Signed)
 Mild to mod, for antibx course zpack and cough med prn,  to f/u any worsening symptoms or concerns

## 2024-07-22 NOTE — Assessment & Plan Note (Signed)
 Recurrent, pt has had cautery in past remotely, now for ENT referral for likely similar tx

## 2024-07-22 NOTE — Assessment & Plan Note (Signed)
 Pt taking samples of lipitor 20 mg every day from [facility], asks for script and f/u labs prior to her CPX with PCP next month

## 2024-07-22 NOTE — Assessment & Plan Note (Signed)
 Last vitamin D Lab Results  Component Value Date   VD25OH 26.94 (L) 07/20/2024   Low, to start oral replacement
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
## 2024-10-06 NOTE — Therapy (Incomplete)
 " OUTPATIENT PHYSICAL THERAPY THORACOLUMBAR EVALUATION   Patient Name: Heather Navarro MRN: [id number] DOB:January 24, 1971, 54 y.o., female Today's Date: 10/06/2024  END OF SESSION:   Past Medical History:  Diagnosis Date   Anemia    Anxiety    Arthritis    Asthma    Depression    Depression    H. 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  RATIONALE FOR EVALUATION AND TREATMENT: Rehabilitation  ONSET DATE: ***  NEXT MD VISIT: ***   SUBJECTIVE:                                                                                                                                                                                                         SUBJECTIVE STATEMENT: 54 y/o patient referred to PT from PCP for LBP.  Referral was initially made to our clinic in 10/25.   Patient has been here in past for this issue, but only attended 2 visits.     PAIN: Are you having pain? Yes: NPRS scale: *** Pain location: low back Pain description: *** Aggravating factors: *** Relieving factors: ***  PERTINENT HISTORY:  Anemia, OA, asthma, depression, anxiety, chronic neck and back pain  PRECAUTIONS: None  RED FLAGS: {PT Red Flags:29287}  WEIGHT BEARING RESTRICTIONS:  No FALLS:  Has patient fallen in last 6 months? {fallsyesno:27318}  LIVING ENVIRONMENT: Lives with: {[facility] lives with:25569::lives with their family} Lives in: {Lives in:25570} Stairs: {opstairs:27293} Has following equipment at home: {Assistive devices:23999}  OCCUPATION: ***  PLOF: {PLOF:24004}  PATIENT GOALS: ***   OBJECTIVE: (objective measures completed at initial evaluation unless otherwise dated)  DIAGNOSTIC FINDINGS:  EXAM: LUMBAR SPINE - COMPLETE 4+ VIEW   COMPARISON:  01/06/2023   FINDINGS: Five non-rib-bearing lumbar vertebra. Normal alignment. Normal vertebral body heights. Mild L5-S1 disc space narrowing and spurring. Slight L4-L5 disc space narrowing. No evidence of fracture, pars defects or focal bone abnormality. The sacroiliac joints are congruent.   IMPRESSION: Mild degenerative disc disease at L4-L5 and L5-S1.     Electronically Signed   By: Andrea Gasman M.D.   On: 12/16/2023 10:49EXAM: MRI ABDOMEN WITHOUT AND WITH CONTRAST   TECHNIQUE: Multiplanar multisequence MR imaging of the abdomen was performed both before and after the administration of intravenous contrast.   CONTRAST:  5.5mL GADAVIST  GADOBUTROL  1 MMOL/ML IV SOLN   COMPARISON:  CT stone study of 01/06/2023.   FINDINGS: Lower chest: Normal heart size without pericardial or pleural effusion.   Hepatobiliary: No cirrhosis. No hepatomegaly. The liver measures 14.6 cm craniocaudal.   Multiple hepatic cysts. A dominant high left hepatic lobe multi septated cyst including at 3.9 x 3.4 cm on 11/04.   On postcontrast imaging, there are also scattered  hyperenhancing lesions which are likely hemangiomas. Example at 7 mm in the posterior right hepatic lobe on 30/12 and 12 mm within the inferior right hepatic lobe on 44/12.   No suspicious liver lesion or biliary abnormality.   Pancreas:  Normal, without mass or ductal dilatation.   Spleen:  Normal in size, without focal abnormality.   Adrenals/Urinary Tract: Normal adrenal glands. Interpolar right renal 8 mm cyst . In the absence of clinically indicated signs/symptoms require(s) no independent follow-up. No hydronephrosis.   Stomach/Bowel: Normal stomach and small bowel. Colonic stool burden suggests constipation.   Vascular/Lymphatic: Normal caliber of the aorta and branch vessels. No retroperitoneal or retrocrural adenopathy.   Other:  No ascites.   Musculoskeletal: A left-sided L3 enhancing lesion of 1.6 cm is consistent with a hemangioma, especially based on CT appearance.   IMPRESSION: 1. Hepatic cysts, complex cysts, hemangiomas as detailed above. No hepatomegaly. 2.  No acute abdominal process. 3.  Possible constipation.     Electronically Signed   By: Rockey Kilts M.D.   On: 04/15/2023 10:39  PATIENT SURVEYS:  Modified Oswestry:  MODIFIED OSWESTRY DISABILITY SCALE  Date: *** Score  Pain intensity {ODI 1:32962}  2. Personal care (washing, dressing, etc.) {ODI 2:32963}  3. Lifting {ODI 3:32964}  4. Walking {ODI 4:32965}  5. Sitting {ODI 5:32966}  6. Standing {ODI 6:32967}  7. Sleeping {ODI 7:32968}  8. Social Life {ODI 8:32969}  9. Traveling {ODI 9:32970}  10. Employment/ Homemaking {ODI 10:32971}  Total ***/50   Interpretation of scores: Score Category Description  0-20% Minimal Disability The patient can cope with most living activities. Usually no treatment is indicated apart from advice on lifting, sitting and exercise  21-40% Moderate Disability The patient experiences more pain and difficulty with sitting, lifting and standing. Travel and social  life are more difficult and they may be disabled from work. Personal care, sexual activity and sleeping are not grossly affected, and the patient can usually be managed by conservative means  41-60% Severe Disability Pain remains the main problem in this group, but activities of daily living are affected. These patients require a detailed investigation  61-80% Crippled Back pain impinges on all aspects of the patients life. Positive intervention is required  81-100% Bed-bound These patients are either bed-bound or exaggerating their symptoms  Bluford FORBES Zoe DELENA Karon DELENA, et al. Surgery versus conservative management of stable thoracolumbar fracture: the PRESTO feasibility RCT. [place] ([place]): [employer]; 2021 Nov. [employer] Assessment, No. 25.62.) Appendix 3, Oswestry Disability Index category descriptors. Available from: Findjewelers.cz  Minimally Clinically Important Difference (MCID) = 12.8%  SCREENING FOR RED FLAGS: Bowel or bladder incontinence: {Yes/No:304960894} Spinal tumors: {Yes/No:304960894} Cauda equina syndrome: {Yes/No:304960894} Compression fracture: {Yes/No:304960894} Abdominal aneurysm: {Yes/No:304960894}  COGNITION:  Overall cognitive status: {cognition:24006}    SENSATION: {sensation:27233}  POSTURE:  {posture:25561}  PALPATION: ***  LUMBAR ROM:   Active  Eval  Flexion   Extension   Right lateral flexion   Left lateral flexion   Right rotation   Left rotation   (Blank rows = not tested)  MUSCLE LENGTH: Hamstrings: Right *** deg; Left *** deg Thomas test: Right *** deg; Left *** deg Hamstrings: *** ITB: *** Piriformis: *** Hip flexors: *** Quads: *** Heelcord: ***  LOWER EXTREMITY ROM:     Active  Right eval Left eval  Hip flexion    Hip extension    Hip abduction    Hip adduction    Hip internal rotation    Hip external rotation    Knee flexion    Knee extension    Ankle dorsiflexion     Ankle plantarflexion    Ankle inversion    Ankle eversion    (Blank rows = not tested)  LOWER EXTREMITY MMT:    MMT Right eval Left eval  Hip flexion    Hip extension    Hip abduction    Hip adduction    Hip internal rotation    Hip external rotation    Knee flexion    Knee extension    Ankle dorsiflexion    Ankle plantarflexion    Ankle inversion    Ankle eversion     (Blank rows = not tested)  LUMBAR SPECIAL TESTS:  {lumbar special test:25242}  FUNCTIONAL TESTS:  {Functional tests:24029}  GAIT: Distance walked: *** Assistive device utilized: {Assistive devices:23999} Level of assistance: {Levels of assistance:24026} Gait pattern: {gait characteristics:25376} Comments: ***   TODAY'S TREATMENT:   ***   PATIENT EDUCATION:  Education details: PT eval findings, anticipated POC, and initial HEP  Person educated: Patient Education method: Explanation, Demonstration, Verbal cues, Tactile cues, Handouts, and [employer] app access provided Education comprehension: verbalized understanding, verbal cues required, tactile cues required, and needs further education  HOME EXERCISE PROGRAM: ***   ASSESSMENT:  CLINICAL IMPRESSION: Mackenzi Stanbery is a 54 y.o. female who was referred to physical therapy for evaluation and treatment for LBP.   Patient reports onset of *** pain beginning ***. Pain is worse with ***.  Patient has deficits in *** ROM, *** LE flexibility, *** strength, abnormal posture, and TTP with abnormal muscle tension *** which are interfering with ADLs and are impacting quality of life.  On Modified Oswestry patient scored ***/50 demonstrating ***% or *** disability.  Tenise Mili will benefit from skilled PT to address above deficits to improve mobility and activity tolerance with decreased pain interference.   ***  OBJECTIVE IMPAIRMENTS: {opptimpairments:25111}.   ACTIVITY LIMITATIONS: {activitylimitations:27494}  PARTICIPATION LIMITATIONS:  {participationrestrictions:25113}  PERSONAL FACTORS: Age, Fitness, Time since onset of injury/illness/exacerbation, and 1-2 comorbidities: Anemia, OA, asthma, depression, anxiety, chronic neck and back pain are also affecting patient's functional outcome.   REHAB POTENTIAL: Good  CLINICAL DECISION MAKING: Evolving/moderate complexity  EVALUATION COMPLEXITY: Moderate   GOALS: Goals reviewed with patient? Yes  SHORT TERM GOALS: Target  date: ***  Patient will be independent with initial HEP to improve outcomes and carryover.  Baseline: 100% PT assist required for correct completion Goal status: INITIAL  2.  Patient will report 25% improvement in low back pain to improve QOL. Baseline: *** Goal status: INITIAL  3.  *** Baseline:  Goal status: {GOALSTATUS:25110}   LONG TERM GOALS: Target date: ***  Patient will be independent with ongoing/advanced HEP for self-management at home.  Baseline: no advanced HETP yet Goal status: INITIAL  2.  Patient will report 50-75% improvement in low back pain to improve QOL.  Baseline: *** Goal status: INITIAL  3.  Patient to demonstrate ability to achieve and maintain good spinal alignment/posturing and body mechanics needed for daily activities. Baseline: *** Goal status: INITIAL  4.  Patient will demonstrate full pain free lumbar ROM to perform ADLs.   Baseline: Refer to above lumbar ROM table Goal status: INITIAL  5.  Patient will demonstrate improved BLE strength to >/= 5/5 for improved stability and ease of mobility. Baseline: Refer to above LE MMT table Goal status: INITIAL  6. Patient will report </= ***% on Modified Oswestry (MCID = 12%) to demonstrate improved functional ability with decreased pain interference. Baseline: *** Goal status: INITIAL  7.  Patient will tolerate *** min of (standing/sitting/walking) w/o increased pain to allow for *** improved mobility and activity tolerance. Baseline: *** Goal status:  INITIAL  8.  Patient will report centralization of radicular symptoms.  Baseline: *** Goal status: {GOALSTATUS:25110}  9.  Patient will ***.  Baseline: *** Goal status: {GOALSTATUS:25110}   10.  *** Baseline: *** Goal status: {GOALSTATUS:25110}    PLAN:  PT FREQUENCY: 1-2x/week  PT DURATION: 8 weeks  PLANNED INTERVENTIONS: 97110-Therapeutic exercises, 97530- Therapeutic activity, [id number]- Neuromuscular re-education, 97535- Self Care, 02859- Manual therapy, [id number]- Electrical stimulation (unattended), 97035- Ultrasound, 02987- Traction (mechanical), 20560 (1-2 muscles), 20561 (3+ muscles)- Dry Needling, Patient/Family education, Taping, Joint mobilization, Spinal mobilization, Cryotherapy, and Moist heat  PLAN FOR NEXT SESSION: PIERRETTE RED SENIOR, PT 10/06/2024, 9:45 PM  "

## 2024-10-09 ENCOUNTER — Ambulatory Visit: Attending: Internal Medicine | Admitting: Rehabilitation

## 2024-10-09 DIAGNOSIS — M6281 Muscle weakness (generalized): Secondary | ICD-10-CM | POA: Insufficient documentation

## 2024-10-09 DIAGNOSIS — R293 Abnormal posture: Secondary | ICD-10-CM | POA: Insufficient documentation

## 2024-10-09 DIAGNOSIS — M5459 Other low back pain: Secondary | ICD-10-CM | POA: Insufficient documentation
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
## 2024-10-17 ENCOUNTER — Other Ambulatory Visit: Payer: Self-pay

## 2024-10-17 ENCOUNTER — Encounter: Payer: Self-pay | Admitting: Rehabilitation

## 2024-10-17 ENCOUNTER — Ambulatory Visit: Admitting: Rehabilitation

## 2024-10-17 DIAGNOSIS — R293 Abnormal posture: Secondary | ICD-10-CM | POA: Diagnosis present

## 2024-10-17 DIAGNOSIS — M6281 Muscle weakness (generalized): Secondary | ICD-10-CM

## 2024-10-17 DIAGNOSIS — M5459 Other low back pain: Secondary | ICD-10-CM

## 2024-10-24 ENCOUNTER — Institutional Professional Consult (permissible substitution) (INDEPENDENT_AMBULATORY_CARE_PROVIDER_SITE_OTHER): Admitting: Otolaryngology

## 2024-10-31 ENCOUNTER — Ambulatory Visit: Admitting: Rehabilitation

## 2024-11-14 ENCOUNTER — Ambulatory Visit: Admitting: Rehabilitation
